# Patient Record
Sex: Male | Born: 1940 | Race: White | Hispanic: No | Marital: Single | Smoking: Former smoker
Health system: Southern US, Community
[De-identification: ages and names within clinical notes are randomized; demographics above are authoritative.]

## PROBLEM LIST (undated history)

## (undated) DIAGNOSIS — R42 Dizziness and giddiness: Secondary | ICD-10-CM

## (undated) DIAGNOSIS — M1990 Unspecified osteoarthritis, unspecified site: Secondary | ICD-10-CM

## (undated) DIAGNOSIS — N529 Male erectile dysfunction, unspecified: Secondary | ICD-10-CM

## (undated) DIAGNOSIS — E119 Type 2 diabetes mellitus without complications: Secondary | ICD-10-CM

## (undated) DIAGNOSIS — N400 Enlarged prostate without lower urinary tract symptoms: Secondary | ICD-10-CM

## (undated) DIAGNOSIS — E785 Hyperlipidemia, unspecified: Secondary | ICD-10-CM

## (undated) DIAGNOSIS — T7840XA Allergy, unspecified, initial encounter: Secondary | ICD-10-CM

## (undated) DIAGNOSIS — M549 Dorsalgia, unspecified: Secondary | ICD-10-CM

## (undated) HISTORY — DX: Dizziness and giddiness: R42

## (undated) HISTORY — DX: Benign prostatic hyperplasia without lower urinary tract symptoms: N40.0

## (undated) HISTORY — DX: Unspecified osteoarthritis, unspecified site: M19.90

## (undated) HISTORY — DX: Hyperlipidemia, unspecified: E78.5

## (undated) HISTORY — DX: Allergy, unspecified, initial encounter: T78.40XA

## (undated) HISTORY — DX: Male erectile dysfunction, unspecified: N52.9

## (undated) HISTORY — DX: Dorsalgia, unspecified: M54.9

## (undated) HISTORY — DX: Type 2 diabetes mellitus without complications: E11.9

---

## 1966-03-05 HISTORY — PX: TONSILLECTOMY: SUR1361

## 1997-08-06 ENCOUNTER — Encounter: Payer: Self-pay | Admitting: Family Medicine

## 1998-08-11 ENCOUNTER — Encounter: Payer: Self-pay | Admitting: Family Medicine

## 1998-08-11 LAB — CONVERTED CEMR LAB: PSA: 1.1 ng/mL

## 1999-07-27 ENCOUNTER — Encounter: Payer: Self-pay | Admitting: Family Medicine

## 2000-08-12 ENCOUNTER — Encounter: Payer: Self-pay | Admitting: Family Medicine

## 2001-11-06 ENCOUNTER — Encounter: Payer: Self-pay | Admitting: Family Medicine

## 2001-11-06 LAB — CONVERTED CEMR LAB
PSA: 0.5 ng/mL
TSH: 1.65 microintl units/mL

## 2002-12-30 ENCOUNTER — Encounter: Payer: Self-pay | Admitting: Family Medicine

## 2002-12-30 LAB — CONVERTED CEMR LAB
PSA: 0.6 ng/mL
TSH: 2.39 microintl units/mL

## 2004-02-01 ENCOUNTER — Ambulatory Visit: Payer: Self-pay | Admitting: Family Medicine

## 2004-02-25 ENCOUNTER — Ambulatory Visit: Payer: Self-pay | Admitting: Family Medicine

## 2004-07-26 ENCOUNTER — Ambulatory Visit: Payer: Self-pay | Admitting: Family Medicine

## 2004-07-26 LAB — CONVERTED CEMR LAB: Blood Glucose, Fasting: 106 mg/dL

## 2004-08-30 ENCOUNTER — Ambulatory Visit: Payer: Self-pay | Admitting: Family Medicine

## 2005-03-20 ENCOUNTER — Ambulatory Visit: Payer: Self-pay | Admitting: Family Medicine

## 2005-09-12 ENCOUNTER — Ambulatory Visit: Payer: Self-pay | Admitting: Family Medicine

## 2005-10-19 ENCOUNTER — Ambulatory Visit: Payer: Self-pay | Admitting: Family Medicine

## 2005-10-19 LAB — CONVERTED CEMR LAB: TSH: 1.78 microintl units/mL

## 2005-10-23 ENCOUNTER — Ambulatory Visit: Payer: Self-pay | Admitting: Family Medicine

## 2005-11-08 ENCOUNTER — Ambulatory Visit: Payer: Self-pay | Admitting: Family Medicine

## 2006-05-22 ENCOUNTER — Ambulatory Visit: Payer: Self-pay | Admitting: Family Medicine

## 2006-05-22 LAB — CONVERTED CEMR LAB: Creatinine, Ser: 1.3 mg/dL (ref 0.4–1.5)

## 2006-05-23 LAB — FECAL OCCULT BLOOD, GUAIAC: Fecal Occult Blood: NEGATIVE

## 2006-05-28 ENCOUNTER — Ambulatory Visit: Payer: Self-pay | Admitting: Cardiology

## 2006-05-29 ENCOUNTER — Ambulatory Visit: Payer: Self-pay | Admitting: Family Medicine

## 2007-08-05 ENCOUNTER — Encounter: Payer: Self-pay | Admitting: Family Medicine

## 2007-08-05 ENCOUNTER — Ambulatory Visit: Payer: Self-pay | Admitting: Family Medicine

## 2007-08-05 DIAGNOSIS — M129 Arthropathy, unspecified: Secondary | ICD-10-CM | POA: Insufficient documentation

## 2007-08-05 DIAGNOSIS — N401 Enlarged prostate with lower urinary tract symptoms: Secondary | ICD-10-CM | POA: Insufficient documentation

## 2007-08-05 DIAGNOSIS — T7840XA Allergy, unspecified, initial encounter: Secondary | ICD-10-CM | POA: Insufficient documentation

## 2007-08-05 DIAGNOSIS — R7309 Other abnormal glucose: Secondary | ICD-10-CM

## 2007-08-05 DIAGNOSIS — E785 Hyperlipidemia, unspecified: Secondary | ICD-10-CM | POA: Insufficient documentation

## 2007-09-08 ENCOUNTER — Ambulatory Visit: Payer: Self-pay | Admitting: Family Medicine

## 2007-09-08 LAB — CONVERTED CEMR LAB
ALT: 16 units/L (ref 0–53)
Alkaline Phosphatase: 60 units/L (ref 39–117)
Bilirubin, Direct: 0.1 mg/dL (ref 0.0–0.3)
CO2: 29 meq/L (ref 19–32)
Glucose, Bld: 96 mg/dL (ref 70–99)
HDL: 42.8 mg/dL (ref >39.0)
Hemoglobin: 16.8 g/dL (ref 13.0–17.0)
LDL Cholesterol: 107 mg/dL — ABNORMAL HIGH (ref 0–99)
Lymphocytes Relative: 32 % (ref 12.0–46.0)
Monocytes Relative: 11.2 % (ref 3.0–12.0)
Platelets: 156 10*3/uL (ref 150–400)
Potassium: 4.5 meq/L (ref 3.5–5.1)
RDW: 12.8 % (ref 11.5–14.6)
Sodium: 141 meq/L (ref 135–145)
Total CHOL/HDL Ratio: 4.2
Total Protein: 6.5 g/dL (ref 6.0–8.3)
Triglycerides: 147 mg/dL (ref 0–149)

## 2007-09-12 ENCOUNTER — Ambulatory Visit: Payer: Self-pay | Admitting: Family Medicine

## 2008-03-15 ENCOUNTER — Ambulatory Visit: Payer: Self-pay | Admitting: Family Medicine

## 2008-08-18 ENCOUNTER — Ambulatory Visit: Payer: Self-pay | Admitting: Family Medicine

## 2008-08-18 DIAGNOSIS — H8110 Benign paroxysmal vertigo, unspecified ear: Secondary | ICD-10-CM | POA: Insufficient documentation

## 2009-08-22 ENCOUNTER — Ambulatory Visit: Payer: Self-pay | Admitting: Family Medicine

## 2009-10-11 ENCOUNTER — Encounter (INDEPENDENT_AMBULATORY_CARE_PROVIDER_SITE_OTHER): Payer: Self-pay | Admitting: *Deleted

## 2010-03-08 ENCOUNTER — Encounter: Payer: Self-pay | Admitting: Family Medicine

## 2010-03-15 ENCOUNTER — Ambulatory Visit
Admission: RE | Admit: 2010-03-15 | Discharge: 2010-03-15 | Payer: Self-pay | Source: Home / Self Care | Attending: Family Medicine | Admitting: Family Medicine

## 2010-03-15 DIAGNOSIS — R109 Unspecified abdominal pain: Secondary | ICD-10-CM | POA: Insufficient documentation

## 2010-03-15 DIAGNOSIS — R1030 Lower abdominal pain, unspecified: Secondary | ICD-10-CM | POA: Insufficient documentation

## 2010-03-28 ENCOUNTER — Encounter: Payer: Self-pay | Admitting: Family Medicine

## 2010-04-05 NOTE — Assessment & Plan Note (Signed)
Summary: REFILL MEDICATIIONS/CLE   Vital Signs:  Patient profile:   70 year old male Height:      66 inches Weight:      164.75 pounds BMI:     26.69 Temp:     98.3 degrees F oral Pulse rate:   60 / minute Pulse rhythm:   regular BP sitting:   122 / 78  (left arm) Cuff size:   regular  Vitals Entered By: Sydell Axon LPN (August 22, 2009 8:58 AM) CC: Needs refills on medications   History of Present Illness: Pt here for medication refill. He is doing well and has no complaints. He still has very rare dizziness but otherwise is pretty much without any compaint. He tolerates his medications well without any problems. He takes his Mobic with food. 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 He enjoys his lifestyle.  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 1 Tablet At Bedtime 2)  Flomax 0.4 Mg  Cp24 (Tamsulosin Hcl) .Marland Kitchen.. 1 Tablet Daily By Mouth 3)  Proscar 5 Mg  Tabs (Finasteride) .Marland Kitchen.. 1 Tablet Daily By Mouth 4)  Viagra 50 Mg  Tabs (Sildenafil Citrate) .Marland Kitchen.. 1 Hour Pior To Relations 5)  Mobic 15 Mg  Tabs (Meloxicam) .... One Tab By Mouth After Supper. 6)  Antivert 25 Mg Tabs (Meclizine Hcl) .... One Tab By Mouth Every 6 Hrs As Needed.  Allergies: 1)  ! * Penicillin  Physical Exam  General:  Well-developed,well-nourished,in no acute distress; alert,appropriate and cooperative throughout examination. Head:  Normocephalic and atraumatic without obvious abnormalities. No apparent  alopecia or balding. Sinuses nontender. Eyes:  Conjunctiva clear bilaterally.  Ears:  External ear exam shows no significant lesions or deformities.  Otoscopic examination reveals clear canals, tympanic membranes are intact bilaterally without bulging, retraction, inflammation or discharge. Hearing is grossly normal bilaterally. Nose:  External nasal examination shows no deformity or inflammation. Nasal mucosa are pink and moist without lesions or exudates. Mouth:  Oral mucosa and oropharynx without lesions or exudates.  Teeth in good repair. Neck:  No deformities, masses, or tenderness noted. Chest Wall:  No deformities, masses, tenderness or gynecomastia noted. Lungs:  Normal respiratory effort, chest expands symmetrically. Lungs are clear to auscultation, no crackles or wheezes. Heart:  Normal rate and regular rhythm. S1 and S2 normal without gallop, murmur, click, rub or other extra sounds.   Impression & Recommendations:  Problem # 1:  BENIGN POSITIONAL VERTIGO (ICD-386.11) Assessment Improved Now with rare sxs. Tolerates reasonably well. His updated medication list for this problem includes:    Antivert 25 Mg Tabs (Meclizine hcl) ..... One tab by mouth every 6 hrs as needed.  Problem # 2:  UNSPECIFIED ARTHROPATHY MULTIPLE SITES (ICD-716.99) Assessment: Unchanged Is fairly active doing car work, in positions that challenge his skeleten! Cont careful use of Mobic. Always take with food.  Problem # 3:  HYPERLIPIDEMIA (ICD-272.4) Assessment: Unchanged Cont Lipitor. Recheck later this year. His updated medication list for this problem includes:  Lipitor 10 Mg Tabs (Atorvastatin calcium) .Marland Kitchen... 1 tablet at bedtime  Problem # 4:  HYPERGLYCEMIA (ICD-790.29) Assessment: Unchanged Discussed avoiding sweets and carbs. Recheck fasting later this year.  Problem # 5:  HYPERTROPHY PROSTATE W/UR OBST & OTH LUTS (ICD-600.01) Assessment: Unchanged Stable sxs on Flomax and Proscar. Cont. Will  check PSA later this year.  Complete Medication List: 1)  Lipitor 10 Mg Tabs (Atorvastatin calcium) .Marland Kitchen.. 1 tablet at bedtime 2)  Flomax 0.4 Mg Cp24 (Tamsulosin hcl) .Marland Kitchen.. 1 tablet daily by mouth 3)  Proscar 5 Mg Tabs (Finasteride) .Marland Kitchen.. 1 tablet daily by mouth 4)  Viagra 50 Mg Tabs (Sildenafil citrate) .Marland Kitchen.. 1 hour pior to relations 5)  Mobic 15 Mg Tabs (Meloxicam) .... One tab by mouth after supper. 6)  Antivert 25 Mg Tabs (Meclizine hcl) .... One tab by mouth every 6 hrs as needed.  Patient Instructions: 1)  RTC later this year for Comp Exam, labs prior. Prescriptions: MOBIC 15 MG  TABS (MELOXICAM) one tab by mouth after supper.  #90 x 3   Entered by:   Sydell Axon LPN   Authorized by:   Shaune Leeks MD   Signed by:   Sydell Axon LPN on 76/19/5093   Method used:   Print then Give to Patient   RxID:   [id number] PROSCAR 5 MG  TABS (FINASTERIDE) 1 tablet daily by mouth  #90 x 3   Entered by:   Sydell Axon LPN   Authorized by:   Shaune Leeks MD   Signed by:   Sydell Axon LPN on 50/53/9767   Method used:   Print then Give to Patient   RxID:   [id number] FLOMAX 0.4 MG  CP24 (TAMSULOSIN HCL) 1 tablet daily by mouth  #90 x 3   Entered by:   Sydell Axon LPN   Authorized by:   Shaune Leeks MD   Signed by:   Sydell Axon LPN on 29/92/4268   Method used:   Print then Give to Patient   RxID:   [id number] LIPITOR 10 MG  TABS (ATORVASTATIN CALCIUM) 1 tablet at bedtime  #90 x 3   Entered by:   Sydell Axon LPN   Authorized by:   Shaune Leeks MD   Signed by:   Sydell Axon LPN on 94/17/4081   Method used:   Print then Give to Patient   RxID:   [id number]   Current Allergies (reviewed today): ! * PENICILLIN

## 2010-04-05 NOTE — Letter (Signed)
Summary: Nadara Eaton letter  [facility]  [address] [place], [place] [address]   Phone: [phone]  Fax: [phone]       10/11/2009 MRN: [id number]  [facility] [address] [place], [place]  [address]  Dear Mr. [facility] [place], and [place] announce the retirement of Arta Silence, M.D., from full-time practice at the [facility] office effective September 01, 2009 and his plans of returning part-time.  It is important to Dr. Hetty Ely and to our practice that you understand that [facility] has seven physicians in our office for your health care needs.  We will continue to offer the same exceptional care that you have today.    Dr. Hetty Ely has spoken to many of you about his plans for retirement and returning part-time in the fall.   We will continue to work with you through the transition to schedule appointments for you in the office and meet the high standards that [place] is committed to.   Again, it is with great pleasure that we share the news that Dr. Hetty Ely will return to [facility] at [facility] in October of 2011 with a reduced schedule.    If you have any questions, or would like to request an appointment with one of our physicians, please call us at [phone] and press the option for Scheduling an appointment.  We take pleasure in providing you with excellent patient care and look forward to seeing you at your next office visit.  Our [facility] Physicians are:  Tillman Abide, M.D. Laurita Quint, M.D. Roxy Manns, M.D. Kerby Nora, M.D. Hannah Beat, M.D. Ruthe Mannan, M.D. We proudly welcomed Raechel Ache, M.D. and Eustaquio Boyden, M.D. to the practice in July/August 2011.  Sincerely,  [facility]

## 2010-04-06 NOTE — Assessment & Plan Note (Signed)
Summary: pain in lower groin area   Vital Signs:  Patient profile:   70 year old male Weight:      169 pounds Temp:     97.4 degrees F oral Pulse rate:   60 / minute Pulse rhythm:   regular BP sitting:   134 / 80  (left arm) Cuff size:   regular  Vitals Entered By: Sydell Axon LPN (March 15, 2010 8:43 AM) CC: Pain in lower groin area   History of Present Illness: Pt here for right sided lower inguinal pain. It does not appear to be assoc to staining...not assoc with straining, worse in the AM than in the evening. This has been going on for quite a while, had been 1-2/10 now typically 3-4 but increases to 6-8 at times. The dull lower pain is there all the time. He has pain with sitting and flexing the right leg. Standing is not as bad. He complained of this problem in 2008 and had CT abd and pelvis done then which were essentially unrevealing. He occas gets a shooting pain along the inguinal ligament distribution which is fleeting for seconds and resolves, again not particularly related to activity or position. Pushing does not recreate. He feels no bulge with pain. He took Celbrex in 2008 for joint discomfort and seemed to have no impact on this.  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 1 Tablet At Bedtime 2)  Flomax 0.4 Mg  Cp24 (Tamsulosin Hcl) .Marland Kitchen.. 1 Tablet Daily By Mouth 3)  Proscar 5 Mg  Tabs (Finasteride) .Marland Kitchen.. 1 Tablet Daily By Mouth 4)  Viagra 50 Mg  Tabs (Sildenafil Citrate) .Marland Kitchen.. 1 Hour Pior To Relations 5)  Mobic 15 Mg  Tabs (Meloxicam) .... One Tab By Mouth After Supper. 6)   Antivert 25 Mg Tabs (Meclizine Hcl) .... One Tab By Mouth Every 6 Hrs As Needed.  Allergies: 1)  ! * Penicillin  Past History:  Family History: Last updated: 08/05/2007 Father: dec 62  MI MULTIPLE  Mother: A  31  DM  HTN BROTHER  A 61  CAD BROTHER A 56   CV: + FATHER/ + BROTHER HBP: + MOTHER DM: +MOTHER PROSTATE CANCER: NEGATIVE GOUT/ARTHRITIS: + SELF BREAST/OVARIAN/UTERINE CANCER: NEGATIVE COLON CANCER: NEGATIVE DEPRESSION: ETOH/DRUG ABUSE: OTHER NEGATIVE STROKES  Social History: Last updated: 08/05/2007 Occupation:[employer] Married lives w/ wife  Risk Factors: Alcohol Use: 0 (08/05/2007) Caffeine Use: 1 (08/05/2007) Exercise: yes (08/05/2007)  Risk Factors: Smoking Status: quit (08/05/2007) Packs/Day: 1993--20PYH (08/05/2007) Passive Smoke Exposure: no (08/05/2007)  Past Surgical History: TONSILLECTOMY 1968 CT ABD Mild GB Thickening HH   CT PELVIS Mild Disc Bulge L5/S1   05/28/2006  Physical Exam  General:  Well-developed,well-nourished,in no acute distress; alert,appropriate and cooperative throughout examination. Lungs:  Normal respiratory effort, chest expands symmetrically. Lungs are clear to auscultation, no crackles or wheezes. Heart:  Normal rate and regular rhythm. S1 and S2 normal without gallop, murmur, click, rub or other extra sounds. Abdomen:  Bowel sounds positive,abdomen soft and non-tender without masses, organomegaly or hernias noted. No pain to palpation along right inguinal ligament. No pain with flex/ext or resisted flex/ext.  Genitalia:  Testes bilaterally descended without nodularity, tenderness or masses. No scrotal masses or lesions. No penis lesions or urethral discharge.   Impression & Recommendations:  Problem # 1:  INGUINAL PAIN, RIGHT (ICD-789.09) Assessment Deteriorated  Slightly worse over the years. Will have pt see Dr Lemar Livings for assessment. Does not act like inguinal ligament strain but is the correct distribution. No  activity over the holidays has not improved. His updated medication list for this problem includes:    Mobic 15 Mg Tabs (Meloxicam) ..... One tab by mouth after supper.  Orders: Surgical Referral (Surgery)  Complete Medication List: 1)  Lipitor 10 Mg Tabs (Atorvastatin calcium) .Marland Kitchen.. 1 tablet at bedtime 2)  Flomax 0.4 Mg Cp24 (Tamsulosin hcl) .Marland Kitchen.. 1 tablet daily by mouth 3)  Proscar 5 Mg Tabs (Finasteride) .Marland Kitchen.. 1 tablet daily by mouth 4)  Viagra 50 Mg Tabs (Sildenafil citrate) .Marland Kitchen.. 1 hour pior to relations 5)  Mobic 15 Mg Tabs (Meloxicam) .... One tab by mouth after supper. 6)  Antivert 25 Mg Tabs (Meclizine hcl) .... One tab by mouth every 6 hrs as needed.  Patient Instructions: 1)  RTC for Comp Exam, labs prior.  2)  Refer to Dr Lemar Livings.   Orders Added: 1)  Surgical Referral [Surgery] 2)  Est. Patient Level III [[address]]    Current Allergies (reviewed today): ! * PENICILLIN

## 2010-04-06 NOTE — Medication Information (Signed)
Summary: [employer] Drug Safety Consideration Form  [employer] Drug Safety Consideration Form   Imported By: Beau Fanny 03/09/2010 13:57:08  _____________________________________________________________________  External Attachment:    Type:   Image     Comment:   External Document

## 2010-04-08 ENCOUNTER — Encounter: Payer: Self-pay | Admitting: Family Medicine

## 2010-04-12 NOTE — Letter (Signed)
Summary: [facility]  [facility]   Imported By: Kassie Mends 04/07/2010 09:16:34  _____________________________________________________________________  External Attachment:    Type:   Image     Comment:   External Document

## 2010-05-22 ENCOUNTER — Other Ambulatory Visit (INDEPENDENT_AMBULATORY_CARE_PROVIDER_SITE_OTHER): Payer: Medicare Other

## 2010-05-22 ENCOUNTER — Other Ambulatory Visit: Payer: Self-pay | Admitting: Family Medicine

## 2010-05-22 ENCOUNTER — Encounter: Payer: Self-pay | Admitting: *Deleted

## 2010-05-22 DIAGNOSIS — H8110 Benign paroxysmal vertigo, unspecified ear: Secondary | ICD-10-CM

## 2010-05-22 DIAGNOSIS — E785 Hyperlipidemia, unspecified: Secondary | ICD-10-CM

## 2010-05-22 DIAGNOSIS — R7309 Other abnormal glucose: Secondary | ICD-10-CM

## 2010-05-22 DIAGNOSIS — N401 Enlarged prostate with lower urinary tract symptoms: Secondary | ICD-10-CM

## 2010-05-22 LAB — MICROALBUMIN / CREATININE URINE RATIO
Creatinine,U: 120.6 mg/dL
Microalb Creat Ratio: 0.4 mg/g (ref 0.0–30.0)
Microalb, Ur: 0.5 mg/dL (ref 0.0–1.9)

## 2010-05-22 LAB — HEPATIC FUNCTION PANEL
ALT: 18 U/L (ref 0–53)
AST: 22 U/L (ref 0–37)
Alkaline Phosphatase: 57 U/L (ref 39–117)
Bilirubin, Direct: 0.1 mg/dL (ref 0.0–0.3)
Total Bilirubin: 0.8 mg/dL (ref 0.3–1.2)
Total Protein: 6.6 g/dL (ref 6.0–8.3)

## 2010-05-22 LAB — BASIC METABOLIC PANEL
BUN: 20 mg/dL (ref 6–23)
Chloride: 104 mEq/L (ref 96–112)
Creatinine, Ser: 1.2 mg/dL (ref 0.4–1.5)
GFR: 67.03 mL/min (ref >60.00)
Potassium: 4.6 mEq/L (ref 3.5–5.1)

## 2010-05-22 LAB — CBC WITH DIFFERENTIAL/PLATELET
Basophils Relative: 0.7 % (ref 0.0–3.0)
Eosinophils Relative: 4.8 % (ref 0.0–5.0)
MCV: 92.4 fl (ref 78.0–100.0)
Monocytes Relative: 9.7 % (ref 3.0–12.0)
Neutrophils Relative %: 53.4 % (ref 43.0–77.0)
Platelets: 154 10*3/uL (ref 150.0–400.0)
RBC: 5.28 Mil/uL (ref 4.22–5.81)
WBC: 6.4 10*3/uL (ref 4.5–10.5)

## 2010-05-22 LAB — LIPID PANEL
LDL Cholesterol: 102 mg/dL — ABNORMAL HIGH (ref 0–99)
Total CHOL/HDL Ratio: 4
VLDL: 31.2 mg/dL (ref 0.0–40.0)

## 2010-05-25 ENCOUNTER — Encounter: Payer: Self-pay | Admitting: Family Medicine

## 2010-05-25 ENCOUNTER — Ambulatory Visit (INDEPENDENT_AMBULATORY_CARE_PROVIDER_SITE_OTHER): Payer: Medicare Other | Admitting: Family Medicine

## 2010-05-25 VITALS — BP 128/76 | HR 76 | Temp 98.1°F | Ht 66.0 in | Wt 168.5 lb

## 2010-05-25 DIAGNOSIS — Z0000 Encounter for general adult medical examination without abnormal findings: Secondary | ICD-10-CM | POA: Insufficient documentation

## 2010-05-25 DIAGNOSIS — R109 Unspecified abdominal pain: Secondary | ICD-10-CM

## 2010-05-25 DIAGNOSIS — M129 Arthropathy, unspecified: Secondary | ICD-10-CM

## 2010-05-25 DIAGNOSIS — H8110 Benign paroxysmal vertigo, unspecified ear: Secondary | ICD-10-CM

## 2010-05-25 DIAGNOSIS — R7309 Other abnormal glucose: Secondary | ICD-10-CM

## 2010-05-25 DIAGNOSIS — N401 Enlarged prostate with lower urinary tract symptoms: Secondary | ICD-10-CM

## 2010-05-25 DIAGNOSIS — E785 Hyperlipidemia, unspecified: Secondary | ICD-10-CM

## 2010-05-25 NOTE — Assessment & Plan Note (Signed)
Will refer for colonoscopy. He has seen Dr Lemar Livings and prefers to return there.

## 2010-05-25 NOTE — Patient Instructions (Addendum)
Refer to Dr Lemar Livings for Colonoscopy Watch diet and exercise as discussed. Lose weight. Return to clinic one year, sooner prn.

## 2010-05-25 NOTE — Assessment & Plan Note (Signed)
Not a problem recently. Pain resolved.

## 2010-05-25 NOTE — Assessment & Plan Note (Signed)
Sxs very infrequent and he knows how to minimize sxs.

## 2010-05-25 NOTE — Assessment & Plan Note (Signed)
Takes Meloxicam with food and is well controlled.

## 2010-05-25 NOTE — Assessment & Plan Note (Signed)
Chol adequate, can always be better. Discussed diet control.

## 2010-05-25 NOTE — Assessment & Plan Note (Signed)
Stable. Knows to avoid sweets and carbs as much as able.

## 2010-05-25 NOTE — Assessment & Plan Note (Signed)
Sxs well controlled on curr tx.

## 2010-05-25 NOTE — Progress Notes (Addendum)
  Subjective:    Patient ID: Steven Hall, male    DOB: 25-Jun-1940, 70 y.o.   MRN: [id number]  HPI Pt here for Comp Exam. He has elbow pain and has had injections in both and has had help in the remote past. He otherwise is feeling well with no complaint.   Review of Systems  Constitutional: Negative for fever, chills, activity change, appetite change, fatigue and unexpected weight change.  HENT: Positive for tinnitus. Negative for hearing loss, ear pain, congestion, sneezing, trouble swallowing, neck pain, neck stiffness, postnasal drip and ear discharge.   Eyes: Negative for pain, discharge and visual disturbance.  Respiratory: Negative for cough, shortness of breath and wheezing.   Cardiovascular: Negative for chest pain and palpitations.  Gastrointestinal: Negative for nausea, vomiting, abdominal pain, diarrhea, constipation, blood in stool, abdominal distention, anal bleeding and rectal pain.  Genitourinary: Negative for dysuria, frequency and difficulty urinating.  Musculoskeletal: Positive for back pain and arthralgias. Negative for myalgias.  Skin: Negative for color change and rash.  Neurological: Negative for tremors and numbness.  Hematological: Negative for adenopathy. Does not bruise/bleed easily.  Psychiatric/Behavioral: Negative for sleep disturbance, dysphoric mood and agitation.       Objective:   Physical Exam  Constitutional: He is oriented to person, place, and time. He appears well-developed and well-nourished. No distress.  HENT:  Head: Normocephalic and atraumatic.  Right Ear: External ear normal.  Left Ear: External ear normal.  Nose: Nose normal.  Mouth/Throat: Oropharynx is clear and moist.  Eyes: Conjunctivae and EOM are normal. Pupils are equal, round, and reactive to light. No scleral icterus.       Had eye exam 9/11.  Neck: Normal range of motion. Neck supple. No thyromegaly present.  Cardiovascular: Normal rate, regular rhythm, normal heart sounds  and intact distal pulses.   No murmur heard. Pulmonary/Chest: Effort normal and breath sounds normal. No respiratory distress. He has no wheezes. He exhibits no tenderness.  Abdominal: Soft. Bowel sounds are normal. He exhibits no mass. There is no tenderness.  Genitourinary: Rectum normal, prostate normal and penis normal. Guaiac negative stool.  Musculoskeletal: Normal range of motion. He exhibits no tenderness.  Lymphadenopathy:    He has no cervical adenopathy.  Neurological: He is alert and oriented to person, place, and time. He has normal reflexes. He exhibits normal muscle tone. Coordination normal.  Skin: Skin is dry. No rash noted. He is not diaphoretic.  Psychiatric: He has a normal mood and affect. His behavior is normal. Judgment and thought content normal.          Assessment & Plan:   Medicare PE   I have personally reviewed the Medicare Annual Wellness questionnaire and have noted 1. The patient's medical and social history 2. Their use of alcohol, tobacco or illicit drugs 3. Their current medications and supplements 4. The patient's functional ability including ADL's, fall risks, home safety risks and hearing or visual             impairment. 5. Diet and physical activities 6. Evidence for depression or mood disorders   I find no cognitive impairment. He has nml affect, cognition, memory and alertness.

## 2010-06-05 NOTE — Progress Notes (Signed)
Encounter made in error. 

## 2010-06-05 NOTE — Progress Notes (Deleted)
  Subjective:    Patient ID: Steven Hall, male    DOB: 1940/08/03, 70 y.o.   MRN: [id number]  HPI    Review of Systems     Objective:   Physical Exam        Assessment & Plan:

## 2010-06-06 ENCOUNTER — Other Ambulatory Visit: Payer: Self-pay

## 2010-07-04 ENCOUNTER — Ambulatory Visit: Payer: Self-pay | Admitting: General Surgery

## 2010-07-17 ENCOUNTER — Encounter: Payer: Self-pay | Admitting: Family Medicine

## 2010-08-17 ENCOUNTER — Other Ambulatory Visit: Payer: Self-pay | Admitting: *Deleted

## 2010-08-17 MED ORDER — ATORVASTATIN CALCIUM 10 MG PO TABS: 10.0000 mg | ORAL_TABLET | Freq: Every day | ORAL | Status: DC

## 2010-08-17 MED ORDER — MELOXICAM 15 MG PO TABS: 15.0000 mg | ORAL_TABLET | ORAL | Status: DC

## 2010-08-17 MED ORDER — FINASTERIDE 5 MG PO TABS: 5.0000 mg | ORAL_TABLET | Freq: Every day | ORAL | Status: DC

## 2010-08-17 MED ORDER — TAMSULOSIN HCL 0.4 MG PO CAPS: 0.4000 mg | ORAL_CAPSULE | Freq: Every day | ORAL | Status: DC

## 2011-05-22 ENCOUNTER — Other Ambulatory Visit: Payer: Self-pay | Admitting: Family Medicine

## 2011-05-22 DIAGNOSIS — E7800 Pure hypercholesterolemia, unspecified: Secondary | ICD-10-CM

## 2011-05-22 DIAGNOSIS — N400 Enlarged prostate without lower urinary tract symptoms: Secondary | ICD-10-CM

## 2011-05-28 ENCOUNTER — Other Ambulatory Visit (INDEPENDENT_AMBULATORY_CARE_PROVIDER_SITE_OTHER): Payer: BC Managed Care – PPO

## 2011-05-28 DIAGNOSIS — N138 Other obstructive and reflux uropathy: Secondary | ICD-10-CM | POA: Diagnosis not present

## 2011-05-28 DIAGNOSIS — N400 Enlarged prostate without lower urinary tract symptoms: Secondary | ICD-10-CM

## 2011-05-28 DIAGNOSIS — N401 Enlarged prostate with lower urinary tract symptoms: Secondary | ICD-10-CM | POA: Diagnosis not present

## 2011-05-28 DIAGNOSIS — E7800 Pure hypercholesterolemia, unspecified: Secondary | ICD-10-CM

## 2011-05-28 LAB — PSA, MEDICARE: PSA: 0.29 ng/mL (ref <=4.00)

## 2011-05-28 LAB — COMPREHENSIVE METABOLIC PANEL
Alkaline Phosphatase: 55 U/L (ref 39–117)
BUN: 25 mg/dL — ABNORMAL HIGH (ref 6–23)
CO2: 27 mEq/L (ref 19–32)
GFR: 64.24 mL/min (ref >60.00)
Glucose, Bld: 106 mg/dL — ABNORMAL HIGH (ref 70–99)
Total Bilirubin: 0.7 mg/dL (ref 0.3–1.2)

## 2011-05-28 LAB — LIPID PANEL
Cholesterol: 168 mg/dL (ref 0–200)
HDL: 45.8 mg/dL (ref >39.00)
Triglycerides: 200 mg/dL — ABNORMAL HIGH (ref 0.0–149.0)
VLDL: 40 mg/dL (ref 0.0–40.0)

## 2011-06-06 ENCOUNTER — Encounter: Payer: Medicare Other | Admitting: Family Medicine

## 2011-06-07 ENCOUNTER — Encounter: Payer: Self-pay | Admitting: Family Medicine

## 2011-06-07 ENCOUNTER — Ambulatory Visit (INDEPENDENT_AMBULATORY_CARE_PROVIDER_SITE_OTHER): Payer: Medicare Other | Admitting: Family Medicine

## 2011-06-07 VITALS — BP 116/70 | HR 69 | Temp 98.3°F | Ht 66.5 in | Wt 170.0 lb

## 2011-06-07 DIAGNOSIS — Z0000 Encounter for general adult medical examination without abnormal findings: Secondary | ICD-10-CM

## 2011-06-07 DIAGNOSIS — B353 Tinea pedis: Secondary | ICD-10-CM

## 2011-06-07 DIAGNOSIS — R7309 Other abnormal glucose: Secondary | ICD-10-CM | POA: Diagnosis not present

## 2011-06-07 DIAGNOSIS — E785 Hyperlipidemia, unspecified: Secondary | ICD-10-CM | POA: Diagnosis not present

## 2011-06-07 DIAGNOSIS — Z23 Encounter for immunization: Secondary | ICD-10-CM

## 2011-06-07 DIAGNOSIS — N401 Enlarged prostate with lower urinary tract symptoms: Secondary | ICD-10-CM

## 2011-06-07 DIAGNOSIS — M129 Arthropathy, unspecified: Secondary | ICD-10-CM | POA: Diagnosis not present

## 2011-06-07 DIAGNOSIS — N138 Other obstructive and reflux uropathy: Secondary | ICD-10-CM | POA: Diagnosis not present

## 2011-06-07 MED ORDER — ATORVASTATIN CALCIUM 10 MG PO TABS: 10.0000 mg | ORAL_TABLET | Freq: Every day | ORAL | Status: DC

## 2011-06-07 MED ORDER — KETOCONAZOLE 2 % EX CREA: TOPICAL_CREAM | Freq: Every day | CUTANEOUS | Status: AC

## 2011-06-07 MED ORDER — MELOXICAM 15 MG PO TABS: 15.0000 mg | ORAL_TABLET | ORAL | Status: DC

## 2011-06-07 MED ORDER — TAMSULOSIN HCL 0.4 MG PO CAPS: 0.4000 mg | ORAL_CAPSULE | Freq: Every day | ORAL | Status: DC

## 2011-06-07 MED ORDER — FINASTERIDE 5 MG PO TABS: 5.0000 mg | ORAL_TABLET | Freq: Every day | ORAL | Status: DC

## 2011-06-07 NOTE — Progress Notes (Signed)
Addended by: Annamarie Major on: 06/07/2011 09:54 AM   Modules accepted: Orders

## 2011-06-07 NOTE — Assessment & Plan Note (Signed)
Needs to exercise to work on weight and sugar. Recheck yearly.

## 2011-06-07 NOTE — Assessment & Plan Note (Signed)
Cont lipitor, exercise to work on weight and TG.

## 2011-06-07 NOTE — Assessment & Plan Note (Signed)
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  No ADE.

## 2011-06-07 NOTE — Assessment & Plan Note (Signed)
Stable DRE, minimal sx.  Continue meds. PSA wnl.

## 2011-06-07 NOTE — Patient Instructions (Addendum)
I would get a flu shot each fall.   Check with your insurance to see if they will cover the shingles shot. Use the 2% ketoconazole and talc your feet daily.  Change socks twice a day if feet are wet.  Get back to exercising and recheck labs in 1 year.   Glad to see you.

## 2011-06-07 NOTE — Progress Notes (Signed)
I have personally reviewed the Medicare Annual Wellness questionnaire and have noted 1. The patient's medical and social history 2. Their use of alcohol, tobacco or illicit drugs 3. Their current medications and supplements 4. The patient's functional ability including ADL's, fall risks, home safety risks and hearing or visual             impairment. 5. Diet and physical activities 6. Evidence for depression or mood disorders  The patients weight, height, BMI have been recorded in the chart, and visual acuity is per eye clinic.  I have made referrals, counseling and provided education to the patient based review of the above and I have provided the pt with a written personalized care plan for preventive services.  Td and PNA shot due  Flu encouraged for fall.   Shingles shot encouraged.   PSA wnl Colonoscopy done 2012 AAA screening.  Not needed due to CT done prev.    Elevated Cholesterol: Using medications without problems: yes Muscle aches: none known Diet compliance: yes Exercise: some- walking for exercise.    BPH.  Compliant with meds.  PSA wnl.  Stream is variable.  Occ nocturia, not every night.  No burning, unless he stops the meds.   Recently with trouble with athletes foot, not improved with OTC meds.  Changing socks daily.  Feet sweat.    Hand OA, controlled with meds.  No ADE.  Tolerated well.  Taking it with food.  Good effect.   PMH and SH reviewed  ROS: See HPI, otherwise noncontributory.  Meds, vitals, and allergies reviewed.   GEN: nad, alert and oriented HEENT: mucous membranes moist NECK: supple w/o LA CV: rrr.  no murmur PULM: ctab, no inc wob ABD: soft, +bs EXT: no edema SKIN: no acute rash except for typical appearing tinea between 3rd and 4th toes B Prostate gland firm and smooth, no enlargement, nodularity, tenderness, mass, asymmetry or induration.

## 2011-06-07 NOTE — Assessment & Plan Note (Signed)
Bilateral, need to talc and change socks frequently.  Use topical ketoconazole.  F/u prn.

## 2012-05-14 DIAGNOSIS — H2510 Age-related nuclear cataract, unspecified eye: Secondary | ICD-10-CM | POA: Diagnosis not present

## 2012-06-17 ENCOUNTER — Other Ambulatory Visit: Payer: Self-pay | Admitting: Family Medicine

## 2012-06-17 NOTE — Telephone Encounter (Signed)
Sent, please schedule a CPE.  Thanks.   

## 2012-06-17 NOTE — Telephone Encounter (Signed)
Electronic refill requests.  Patient has not been seen in > 1 year with no upcoming appts scheduled.  Please advise.

## 2012-11-30 ENCOUNTER — Other Ambulatory Visit: Payer: Self-pay | Admitting: Family Medicine

## 2012-11-30 DIAGNOSIS — E785 Hyperlipidemia, unspecified: Secondary | ICD-10-CM

## 2012-11-30 DIAGNOSIS — N401 Enlarged prostate with lower urinary tract symptoms: Secondary | ICD-10-CM

## 2012-11-30 DIAGNOSIS — Z125 Encounter for screening for malignant neoplasm of prostate: Secondary | ICD-10-CM

## 2012-12-08 ENCOUNTER — Other Ambulatory Visit (INDEPENDENT_AMBULATORY_CARE_PROVIDER_SITE_OTHER): Payer: Medicare Other

## 2012-12-08 DIAGNOSIS — E785 Hyperlipidemia, unspecified: Secondary | ICD-10-CM | POA: Diagnosis not present

## 2012-12-08 DIAGNOSIS — Z125 Encounter for screening for malignant neoplasm of prostate: Secondary | ICD-10-CM | POA: Diagnosis not present

## 2012-12-08 LAB — COMPREHENSIVE METABOLIC PANEL
ALT: 16 U/L (ref 0–53)
AST: 21 U/L (ref 0–37)
Alkaline Phosphatase: 49 U/L (ref 39–117)
CO2: 26 mEq/L (ref 19–32)
Calcium: 9.3 mg/dL (ref 8.4–10.5)
Chloride: 104 mEq/L (ref 96–112)
Creatinine, Ser: 1.1 mg/dL (ref 0.4–1.5)
GFR: 67.9 mL/min (ref >60.00)
Glucose, Bld: 101 mg/dL — ABNORMAL HIGH (ref 70–99)
Potassium: 4.6 mEq/L (ref 3.5–5.1)
Total Bilirubin: 0.7 mg/dL (ref 0.3–1.2)

## 2012-12-08 LAB — LIPID PANEL
HDL: 51.9 mg/dL (ref >39.00)
LDL Cholesterol: 107 mg/dL — ABNORMAL HIGH (ref 0–99)
Total CHOL/HDL Ratio: 4
Triglycerides: 165 mg/dL — ABNORMAL HIGH (ref 0.0–149.0)
VLDL: 33 mg/dL (ref 0.0–40.0)

## 2012-12-08 LAB — PSA, MEDICARE: PSA: 0.26 ng/ml (ref 0.10–4.00)

## 2012-12-15 ENCOUNTER — Ambulatory Visit (INDEPENDENT_AMBULATORY_CARE_PROVIDER_SITE_OTHER): Payer: Medicare Other | Admitting: Family Medicine

## 2012-12-15 ENCOUNTER — Encounter: Payer: Self-pay | Admitting: Family Medicine

## 2012-12-15 VITALS — BP 130/80 | HR 62 | Temp 97.5°F | Ht 66.0 in | Wt 169.0 lb

## 2012-12-15 DIAGNOSIS — N401 Enlarged prostate with lower urinary tract symptoms: Secondary | ICD-10-CM | POA: Diagnosis not present

## 2012-12-15 DIAGNOSIS — K644 Residual hemorrhoidal skin tags: Secondary | ICD-10-CM | POA: Diagnosis not present

## 2012-12-15 DIAGNOSIS — E785 Hyperlipidemia, unspecified: Secondary | ICD-10-CM | POA: Diagnosis not present

## 2012-12-15 DIAGNOSIS — N138 Other obstructive and reflux uropathy: Secondary | ICD-10-CM

## 2012-12-15 DIAGNOSIS — R109 Unspecified abdominal pain: Secondary | ICD-10-CM

## 2012-12-15 DIAGNOSIS — Z0000 Encounter for general adult medical examination without abnormal findings: Secondary | ICD-10-CM

## 2012-12-15 MED ORDER — FINASTERIDE 5 MG PO TABS: ORAL_TABLET | ORAL | Status: DC

## 2012-12-15 MED ORDER — MELOXICAM 15 MG PO TABS: ORAL_TABLET | ORAL | Status: DC

## 2012-12-15 MED ORDER — ATORVASTATIN CALCIUM 10 MG PO TABS: ORAL_TABLET | ORAL | Status: DC

## 2012-12-15 MED ORDER — TAMSULOSIN HCL 0.4 MG PO CAPS: ORAL_CAPSULE | ORAL | Status: DC

## 2012-12-15 MED ORDER — TRAMADOL HCL 50 MG PO TABS: 50.0000 mg | ORAL_TABLET | Freq: Three times a day (TID) | ORAL | Status: DC | PRN

## 2012-12-15 NOTE — Assessment & Plan Note (Addendum)
See scanned forms.  Routine anticipatory guidance given to patient.  See health maintenance. Flu encouraged.  Shingles encouraged.  PNA 2013 Tetanus 2013 Colon 2012 Prostate cancer screening PSA wnl.  Advance directive encouraged- would have wife designated if incapacitated.   Cognitive function addressed- see scanned forms- and if abnormal then additional documentation follows.  AAA screening not needed due to CT done prev.

## 2012-12-15 NOTE — Assessment & Plan Note (Signed)
Doing well, continue current meds.

## 2012-12-15 NOTE — Assessment & Plan Note (Signed)
With [facility] noted.  Would continue to observe for now, given patient pref.  This is likely the source of the lower abd pain. If worsening, refer to surgery for eval. He agrees. I don't suspect other colonic/intraabdominal issue.

## 2012-12-15 NOTE — Assessment & Plan Note (Signed)
Not acute, likely source of BRBPR.  If worsening, refer to surgery for eval. He agrees. I don't suspect other colonic/intraabdominal issue.

## 2012-12-15 NOTE — Patient Instructions (Addendum)
Check with your insurance to see if they will cover the shingles shot. Use the tramadol for pain if needed.  If the pain or bleeding increases, then we can help you get set up with the surgery clinic.  Take care.  I would get a flu shot each fall.

## 2012-12-15 NOTE — Assessment & Plan Note (Signed)
Reasonable control, he'll continue meds and work on diet/weight. Sugar also discussed. He agrees.

## 2012-12-15 NOTE — Progress Notes (Signed)
I have personally reviewed the Medicare Annual Wellness questionnaire and have noted 1. The patient's medical and social history 2. Their use of alcohol, tobacco or illicit drugs 3. Their current medications and supplements 4. The patient's functional ability including ADL's, fall risks, home safety risks and hearing or visual             impairment. 5. Diet and physical activities 6. Evidence for depression or mood disorders  The patients weight, height, BMI have been recorded in the chart and visual acuity is per eye clinic.  I have made referrals, counseling and provided education to the patient based review of the above and I have provided the pt with a written personalized care plan for preventive services.  See scanned forms.  Routine anticipatory guidance given to patient.  See health maintenance. Flu encouraged.  Shingles encouraged.  PNA 2013 Tetanus 2013 Colon 2012 Prostate cancer screening PSA wnl.  Advance directive encouraged- would have wife designated if incapacitated.   Cognitive function addressed- see scanned forms- and if abnormal then additional documentation follows.   Back pain. R lower side.  Intermittent.  mobic helps some.  Known DDD. occ radiation into the RLQ and R anterior thigh over the last few months. No trauma. No L sided sx.  No rash.    Blood in stool.  Noted x2, over the last ~ month.  Noted with prev episode of frequent BMs.  Prev colonoscopy done 2012 w/o sig findings.  Elevated Cholesterol: Using medications without problems:yes Muscle aches: no Diet compliance:yes Exercise:yes  BPH.  Controlled with meds.  No LUTS with meds.  Doing well.    PMH and SH reviewed  Meds, vitals, and allergies reviewed.   ROS: See HPI.  Otherwise negative.    GEN: nad, alert and oriented HEENT: mucous membranes moist NECK: supple w/o LA CV: rrr. PULM: ctab, no inc wob ABD: soft, +bs, not ttp x4 EXT: no edema SKIN: no acute rash External hemorrhoid  noted RIH noted, small, soft. No midline back pain.

## 2013-09-01 DIAGNOSIS — H2510 Age-related nuclear cataract, unspecified eye: Secondary | ICD-10-CM | POA: Diagnosis not present

## 2013-09-01 DIAGNOSIS — H5210 Myopia, unspecified eye: Secondary | ICD-10-CM | POA: Diagnosis not present

## 2014-01-10 ENCOUNTER — Other Ambulatory Visit: Payer: Self-pay | Admitting: Family Medicine

## 2014-01-11 NOTE — Telephone Encounter (Signed)
Patient not seen in our office in > 1 year.  No upcoming appt scheduled. Please advise.

## 2014-01-12 NOTE — Telephone Encounter (Signed)
Sent, schedule CPE/AMW, thanks.

## 2014-01-12 NOTE — Telephone Encounter (Signed)
Patient notified by telephone that he needs to schedule appointment per Dr. Damita Dunnings. Patient transferred to scheduler.

## 2014-03-09 ENCOUNTER — Encounter: Payer: Self-pay | Admitting: Family Medicine

## 2014-03-09 ENCOUNTER — Ambulatory Visit (INDEPENDENT_AMBULATORY_CARE_PROVIDER_SITE_OTHER): Payer: Medicare Other | Admitting: Family Medicine

## 2014-03-09 VITALS — BP 136/90 | HR 91 | Temp 98.0°F | Ht 66.5 in | Wt 173.5 lb

## 2014-03-09 DIAGNOSIS — Z7189 Other specified counseling: Secondary | ICD-10-CM

## 2014-03-09 DIAGNOSIS — E785 Hyperlipidemia, unspecified: Secondary | ICD-10-CM | POA: Diagnosis not present

## 2014-03-09 DIAGNOSIS — Z125 Encounter for screening for malignant neoplasm of prostate: Secondary | ICD-10-CM

## 2014-03-09 DIAGNOSIS — N401 Enlarged prostate with lower urinary tract symptoms: Secondary | ICD-10-CM

## 2014-03-09 DIAGNOSIS — Z0000 Encounter for general adult medical examination without abnormal findings: Secondary | ICD-10-CM

## 2014-03-09 DIAGNOSIS — M129 Arthropathy, unspecified: Secondary | ICD-10-CM

## 2014-03-09 MED ORDER — TAMSULOSIN HCL 0.4 MG PO CAPS: 0.4000 mg | ORAL_CAPSULE | Freq: Every day | ORAL | Status: DC

## 2014-03-09 MED ORDER — MELOXICAM 15 MG PO TABS: ORAL_TABLET | ORAL | Status: DC

## 2014-03-09 MED ORDER — ATORVASTATIN CALCIUM 10 MG PO TABS: ORAL_TABLET | ORAL | Status: DC

## 2014-03-09 MED ORDER — FINASTERIDE 5 MG PO TABS: 5.0000 mg | ORAL_TABLET | Freq: Every day | ORAL | Status: DC

## 2014-03-09 NOTE — Patient Instructions (Addendum)
Check with your insurance to see if they will cover the shingles shot. Please think about getting a flu shot.  Schedule a fasting lab visit on the way out.  Take care.  Glad to see you.

## 2014-03-09 NOTE — Progress Notes (Signed)
Pre visit review using our clinic review tool, if applicable. No additional management support is needed unless otherwise documented below in the visit note.  I have personally reviewed the Medicare Annual Wellness questionnaire and have noted 1. The patient's medical and social history 2. Their use of alcohol, tobacco or illicit drugs 3. Their current medications and supplements 4. The patient's functional ability including ADL's, fall risks, home safety risks and hearing or visual             impairment. 5. Diet and physical activities 6. Evidence for depression or mood disorders  The patients weight, height, BMI have been recorded in the chart and visual acuity is per eye clinic.  I have made referrals, counseling and provided education to the patient based review of the above and I have provided the pt with a written personalized care plan for preventive services.  Provider list updated- see scanned forms.  Routine anticipatory guidance given to patient.  See health maintenance.  Flu dw pt.  Shingles dw pt.  PNA 2013 Tetanus 2013 Colonoscopy 2012 PSA pending.  He'll return for labs.  Advance directive- wife designated if patient were incapacitated.  Cognitive function addressed- see scanned forms- and if abnormal then additional documentation follows.  AAA screening not needed due to CT done prev.   LUTS controlled with meds.  Stream is good.  Nocturia 0-1 per night.  No ADE on meds.  Compliant with meds.   Elevated Cholesterol: Using medications without problems:yes Muscle aches: no Diet compliance:yes Exercise:yes, splitting wood recently Labs pending, will return when fasting.   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   Worse with weather changes.  No ADE on meds.  No new sx.    PMH and SH reviewed  Meds, vitals, and allergies reviewed.   ROS: See HPI.  Otherwise negative.    GEN: nad, alert and oriented HEENT: mucous membranes moist NECK: supple w/o LA CV:  rrr. PULM: ctab, no inc wob ABD: soft, +bs EXT: no edema SKIN: no acute rash

## 2014-03-10 DIAGNOSIS — Z7189 Other specified counseling: Secondary | ICD-10-CM | POA: Insufficient documentation

## 2014-03-10 NOTE — Assessment & Plan Note (Signed)
Continue current med.  Sx controlled.  Return for labs.

## 2014-03-10 NOTE — Assessment & Plan Note (Signed)
Continue current med.  D/w pt about diet and exercise.  Return for labs.

## 2014-03-10 NOTE — Assessment & Plan Note (Signed)
Flu dw pt.  Shingles dw pt.  PNA 2013 Tetanus 2013 Colonoscopy 2012 PSA pending.  He'll return for labs.  Advance directive- wife designated if patient were incapacitated.  Cognitive function addressed- see scanned forms- and if abnormal then additional documentation follows.  AAA screening not needed due to CT done prev.

## 2014-03-10 NOTE — Assessment & Plan Note (Signed)
Continue current med.  No ADE on meds.  D/w pt.

## 2014-03-11 ENCOUNTER — Other Ambulatory Visit (INDEPENDENT_AMBULATORY_CARE_PROVIDER_SITE_OTHER): Payer: Medicare Other

## 2014-03-11 DIAGNOSIS — Z125 Encounter for screening for malignant neoplasm of prostate: Secondary | ICD-10-CM | POA: Diagnosis not present

## 2014-03-11 DIAGNOSIS — E785 Hyperlipidemia, unspecified: Secondary | ICD-10-CM

## 2014-03-11 LAB — COMPREHENSIVE METABOLIC PANEL
ALBUMIN: 4.1 g/dL (ref 3.5–5.2)
ALT: 20 U/L (ref 0–53)
AST: 22 U/L (ref 0–37)
Alkaline Phosphatase: 73 U/L (ref 39–117)
BUN: 18 mg/dL (ref 6–23)
CALCIUM: 9.2 mg/dL (ref 8.4–10.5)
CHLORIDE: 105 meq/L (ref 96–112)
CO2: 27 mEq/L (ref 19–32)
Creatinine, Ser: 1.2 mg/dL (ref 0.4–1.5)
GFR: 63.12 mL/min (ref >60.00)
Glucose, Bld: 123 mg/dL — ABNORMAL HIGH (ref 70–99)
POTASSIUM: 4.6 meq/L (ref 3.5–5.1)
Sodium: 139 mEq/L (ref 135–145)
Total Bilirubin: 0.8 mg/dL (ref 0.2–1.2)
Total Protein: 6.7 g/dL (ref 6.0–8.3)

## 2014-03-11 LAB — LIPID PANEL
CHOLESTEROL: 222 mg/dL — AB (ref 0–200)
HDL: 45.2 mg/dL (ref >39.00)
NonHDL: 176.8
Total CHOL/HDL Ratio: 5
Triglycerides: 201 mg/dL — ABNORMAL HIGH (ref 0.0–149.0)
VLDL: 40.2 mg/dL — AB (ref 0.0–40.0)

## 2014-03-11 LAB — LDL CHOLESTEROL, DIRECT: Direct LDL: 136.8 mg/dL

## 2014-03-11 LAB — PSA, MEDICARE: PSA: 0.29 ng/mL (ref 0.10–4.00)

## 2014-03-12 ENCOUNTER — Other Ambulatory Visit: Payer: Self-pay | Admitting: Family Medicine

## 2014-03-12 DIAGNOSIS — T50905A Adverse effect of unspecified drugs, medicaments and biological substances, initial encounter: Secondary | ICD-10-CM | POA: Insufficient documentation

## 2014-03-12 DIAGNOSIS — R739 Hyperglycemia, unspecified: Secondary | ICD-10-CM | POA: Insufficient documentation

## 2014-03-12 DIAGNOSIS — E785 Hyperlipidemia, unspecified: Secondary | ICD-10-CM

## 2014-04-28 DIAGNOSIS — B351 Tinea unguium: Secondary | ICD-10-CM | POA: Diagnosis not present

## 2014-04-28 DIAGNOSIS — D229 Melanocytic nevi, unspecified: Secondary | ICD-10-CM | POA: Diagnosis not present

## 2014-04-28 DIAGNOSIS — L821 Other seborrheic keratosis: Secondary | ICD-10-CM | POA: Diagnosis not present

## 2014-04-28 DIAGNOSIS — L820 Inflamed seborrheic keratosis: Secondary | ICD-10-CM | POA: Diagnosis not present

## 2014-04-28 DIAGNOSIS — L578 Other skin changes due to chronic exposure to nonionizing radiation: Secondary | ICD-10-CM | POA: Diagnosis not present

## 2014-04-28 DIAGNOSIS — D692 Other nonthrombocytopenic purpura: Secondary | ICD-10-CM | POA: Diagnosis not present

## 2014-04-28 DIAGNOSIS — L814 Other melanin hyperpigmentation: Secondary | ICD-10-CM | POA: Diagnosis not present

## 2014-04-28 DIAGNOSIS — Z1283 Encounter for screening for malignant neoplasm of skin: Secondary | ICD-10-CM | POA: Diagnosis not present

## 2014-04-28 DIAGNOSIS — L570 Actinic keratosis: Secondary | ICD-10-CM | POA: Diagnosis not present

## 2014-09-01 DIAGNOSIS — L570 Actinic keratosis: Secondary | ICD-10-CM | POA: Diagnosis not present

## 2014-09-01 DIAGNOSIS — L578 Other skin changes due to chronic exposure to nonionizing radiation: Secondary | ICD-10-CM | POA: Diagnosis not present

## 2014-09-01 DIAGNOSIS — L821 Other seborrheic keratosis: Secondary | ICD-10-CM | POA: Diagnosis not present

## 2014-09-08 ENCOUNTER — Other Ambulatory Visit (INDEPENDENT_AMBULATORY_CARE_PROVIDER_SITE_OTHER): Payer: Medicare Other

## 2014-09-08 DIAGNOSIS — E785 Hyperlipidemia, unspecified: Secondary | ICD-10-CM | POA: Diagnosis not present

## 2014-09-08 DIAGNOSIS — R739 Hyperglycemia, unspecified: Secondary | ICD-10-CM

## 2014-09-08 LAB — HEMOGLOBIN A1C: Hgb A1c MFr Bld: 6.2 % (ref 4.6–6.5)

## 2014-09-08 LAB — LIPID PANEL
CHOL/HDL RATIO: 4
Cholesterol: 161 mg/dL (ref 0–200)
HDL: 45.4 mg/dL (ref >39.00)
LDL Cholesterol: 92 mg/dL (ref 0–99)
NONHDL: 115.6
TRIGLYCERIDES: 116 mg/dL (ref 0.0–149.0)
VLDL: 23.2 mg/dL (ref 0.0–40.0)

## 2014-09-08 LAB — GLUCOSE, RANDOM: GLUCOSE: 118 mg/dL — AB (ref 70–99)

## 2014-09-14 ENCOUNTER — Ambulatory Visit (INDEPENDENT_AMBULATORY_CARE_PROVIDER_SITE_OTHER): Payer: Medicare Other | Admitting: Family Medicine

## 2014-09-14 ENCOUNTER — Encounter: Payer: Self-pay | Admitting: Family Medicine

## 2014-09-14 VITALS — BP 130/70 | HR 78 | Temp 98.4°F | Wt 171.0 lb

## 2014-09-14 DIAGNOSIS — E785 Hyperlipidemia, unspecified: Secondary | ICD-10-CM | POA: Diagnosis not present

## 2014-09-14 DIAGNOSIS — R739 Hyperglycemia, unspecified: Secondary | ICD-10-CM | POA: Diagnosis not present

## 2014-09-14 NOTE — Assessment & Plan Note (Signed)
Needs to cut out sweets, continue exercise, recheck labs in about 6 months before a physical.

## 2014-09-14 NOTE — Assessment & Plan Note (Signed)
Improved, continue statin, see above re: diet and exercise. Labs d/w pt.

## 2014-09-14 NOTE — Patient Instructions (Addendum)
Recheck in about 6 months (labs before a physical). Take care.  Glad to see you.  Goal weight loss- about 1 pound a month.  I would cut back on the sweets.

## 2014-09-14 NOTE — Progress Notes (Signed)
Pre visit review using our clinic review tool, if applicable. No additional management support is needed unless otherwise documented below in the visit note.  Elevated Cholesterol: Using medications without problems: yes Muscle aches: no Diet compliance: yes Exercise: some, at home.    Hyperglycemia.  Didn't cut out sweets.  Labs d/w pt.  A1c 6.2.  D/w pt about diet and exercise.    Meds, vitals, and allergies reviewed.   ROS: See HPI.  Otherwise, noncontributory.  GEN: nad, alert and oriented HEENT: mucous membranes moist NECK: supple w/o LA CV: rrr PULM: ctab, no inc wob ABD: soft, +bs EXT: no edema

## 2014-11-11 DIAGNOSIS — H43393 Other vitreous opacities, bilateral: Secondary | ICD-10-CM | POA: Diagnosis not present

## 2014-11-11 DIAGNOSIS — H5203 Hypermetropia, bilateral: Secondary | ICD-10-CM | POA: Diagnosis not present

## 2014-11-11 DIAGNOSIS — H43313 Vitreous membranes and strands, bilateral: Secondary | ICD-10-CM | POA: Diagnosis not present

## 2015-02-07 ENCOUNTER — Other Ambulatory Visit: Payer: Self-pay | Admitting: Family Medicine

## 2015-03-13 ENCOUNTER — Other Ambulatory Visit: Payer: Self-pay | Admitting: Family Medicine

## 2015-03-14 NOTE — Telephone Encounter (Signed)
Electronic refill request. Last Filled:    90 tablet 3 03/09/2014  Patient is due for CPE.  Please advise.

## 2015-03-15 NOTE — Telephone Encounter (Signed)
Schedule CPE.  Sent.  Thanks.  

## 2015-03-15 NOTE — Telephone Encounter (Signed)
Left detailed message on voicemail.  

## 2015-05-07 ENCOUNTER — Other Ambulatory Visit: Payer: Self-pay | Admitting: Family Medicine

## 2015-05-09 NOTE — Telephone Encounter (Signed)
Message left on patient's VM to schedule CPE prior to further refills.

## 2015-06-11 ENCOUNTER — Telehealth: Payer: Self-pay | Admitting: Family Medicine

## 2015-06-13 NOTE — Telephone Encounter (Signed)
Electronic refill request.  Last Filled:   03/14/15 with notation to schedule CPE prior to further refills.  No future appts scheduled.  Last office visit:   09/14/14   Please advise.

## 2015-06-14 ENCOUNTER — Encounter: Payer: Self-pay | Admitting: Family Medicine

## 2015-06-14 NOTE — Telephone Encounter (Signed)
Please schedule appointment as instructed. 

## 2015-06-14 NOTE — Telephone Encounter (Signed)
Sent.  Needs CPE scheduled.  Thanks.  

## 2015-06-14 NOTE — Telephone Encounter (Signed)
Left message asking pt to call office  °

## 2015-06-16 NOTE — Telephone Encounter (Signed)
4/20 pt aware Pt only wanted to make med refill appointment

## 2015-06-23 ENCOUNTER — Encounter: Payer: Self-pay | Admitting: Family Medicine

## 2015-06-23 ENCOUNTER — Ambulatory Visit (INDEPENDENT_AMBULATORY_CARE_PROVIDER_SITE_OTHER): Payer: Medicare Other | Admitting: Family Medicine

## 2015-06-23 VITALS — BP 122/70 | HR 75 | Temp 98.5°F | Wt 169.8 lb

## 2015-06-23 DIAGNOSIS — E785 Hyperlipidemia, unspecified: Secondary | ICD-10-CM | POA: Diagnosis not present

## 2015-06-23 DIAGNOSIS — Z125 Encounter for screening for malignant neoplasm of prostate: Secondary | ICD-10-CM

## 2015-06-23 DIAGNOSIS — R739 Hyperglycemia, unspecified: Secondary | ICD-10-CM | POA: Diagnosis not present

## 2015-06-23 DIAGNOSIS — Z23 Encounter for immunization: Secondary | ICD-10-CM

## 2015-06-23 DIAGNOSIS — N401 Enlarged prostate with lower urinary tract symptoms: Secondary | ICD-10-CM

## 2015-06-23 DIAGNOSIS — M129 Arthropathy, unspecified: Secondary | ICD-10-CM

## 2015-06-23 LAB — LIPID PANEL
CHOLESTEROL: 176 mg/dL (ref 0–200)
HDL: 45.5 mg/dL (ref >39.00)
NonHDL: 130.92
Total CHOL/HDL Ratio: 4
Triglycerides: 231 mg/dL — ABNORMAL HIGH (ref 0.0–149.0)
VLDL: 46.2 mg/dL — AB (ref 0.0–40.0)

## 2015-06-23 LAB — COMPREHENSIVE METABOLIC PANEL
ALBUMIN: 4.2 g/dL (ref 3.5–5.2)
ALT: 11 U/L (ref 0–53)
AST: 17 U/L (ref 0–37)
Alkaline Phosphatase: 68 U/L (ref 39–117)
BUN: 20 mg/dL (ref 6–23)
CALCIUM: 9.5 mg/dL (ref 8.4–10.5)
CHLORIDE: 107 meq/L (ref 96–112)
CO2: 27 meq/L (ref 19–32)
CREATININE: 1.17 mg/dL (ref 0.40–1.50)
GFR: 64.77 mL/min (ref >60.00)
Glucose, Bld: 120 mg/dL — ABNORMAL HIGH (ref 70–99)
POTASSIUM: 4.4 meq/L (ref 3.5–5.1)
Sodium: 139 mEq/L (ref 135–145)
Total Bilirubin: 0.5 mg/dL (ref 0.2–1.2)
Total Protein: 6.6 g/dL (ref 6.0–8.3)

## 2015-06-23 LAB — HEMOGLOBIN A1C: Hgb A1c MFr Bld: 6.2 % (ref 4.6–6.5)

## 2015-06-23 LAB — LDL CHOLESTEROL, DIRECT: Direct LDL: 100 mg/dL

## 2015-06-23 LAB — PSA, MEDICARE: PSA: 0.5 ng/ml (ref 0.10–4.00)

## 2015-06-23 MED ORDER — MELOXICAM 15 MG PO TABS: ORAL_TABLET | ORAL | Status: DC

## 2015-06-23 MED ORDER — TRAMADOL HCL 50 MG PO TABS: 50.0000 mg | ORAL_TABLET | Freq: Three times a day (TID) | ORAL | Status: DC | PRN

## 2015-06-23 MED ORDER — ATORVASTATIN CALCIUM 10 MG PO TABS: ORAL_TABLET | ORAL | Status: DC

## 2015-06-23 MED ORDER — FINASTERIDE 5 MG PO TABS: ORAL_TABLET | ORAL | Status: DC

## 2015-06-23 MED ORDER — TAMSULOSIN HCL 0.4 MG PO CAPS: ORAL_CAPSULE | ORAL | Status: DC

## 2015-06-23 NOTE — Patient Instructions (Addendum)
Check with your insurance to see if they will cover the shingles shot. Go to the lab on the way out.  We'll contact you with your lab report. Take care.  Glad to see you.  Recheck next year, sooner if needed.

## 2015-06-23 NOTE — Progress Notes (Signed)
Pre visit review using our clinic review tool, if applicable. No additional management support is needed unless otherwise documented below in the visit note.  BPH.  Compliant with meds.  No LUTS.  Doing well.  Not lightheaded.  He is clearly improved on meds.  No frequency.  Psa pending.   Elevated Cholesterol: Using medications without problems:yes Muscle aches: no Diet compliance:yes Exercise:yes Due for labs.  Pending.    OA.  Rare use of tramadol.  Taking meloxicam w/o ADE. B hand OA.  No new sx.  H/o mild hyperglycemia.  Due for f/u A1c.  D/w pt.   Advance directive- wife designated if patient were incapacitated.   Due for PNA vaccine, d/w pt.  He agrees.   Shingles shot d/w pt.     PMH and SH reviewed  ROS: See HPI, otherwise noncontributory.  Meds, vitals, and allergies reviewed.   GEN: nad, alert and oriented HEENT: mucous membranes moist NECK: supple w/o LA CV: rrr.  no murmur PULM: ctab, no inc wob ABD: soft, +bs EXT: no edema SKIN: no acute rash DRE deferred.

## 2015-06-23 NOTE — Assessment & Plan Note (Signed)
Continue statin.  No ADE.  Labs pending.

## 2015-06-23 NOTE — Assessment & Plan Note (Signed)
Compliant with meds.  No LUTS.  Doing well.  Not lightheaded.  He is clearly improved on meds.  No frequency.  Psa pending.

## 2015-06-23 NOTE — Assessment & Plan Note (Signed)
H/o dx, recheck A1c.  D/w pt.  He agrees.

## 2015-06-23 NOTE — Assessment & Plan Note (Signed)
Continue meloxicam with prn tramadol use.  Recheck Cr today.  No ADE on med.

## 2015-06-27 ENCOUNTER — Encounter: Payer: Self-pay | Admitting: *Deleted

## 2015-09-01 DIAGNOSIS — L981 Factitial dermatitis: Secondary | ICD-10-CM | POA: Diagnosis not present

## 2015-09-01 DIAGNOSIS — L578 Other skin changes due to chronic exposure to nonionizing radiation: Secondary | ICD-10-CM | POA: Diagnosis not present

## 2015-09-01 DIAGNOSIS — Z1283 Encounter for screening for malignant neoplasm of skin: Secondary | ICD-10-CM | POA: Diagnosis not present

## 2015-09-01 DIAGNOSIS — L821 Other seborrheic keratosis: Secondary | ICD-10-CM | POA: Diagnosis not present

## 2015-09-01 DIAGNOSIS — L812 Freckles: Secondary | ICD-10-CM | POA: Diagnosis not present

## 2015-09-01 DIAGNOSIS — L570 Actinic keratosis: Secondary | ICD-10-CM | POA: Diagnosis not present

## 2015-09-01 DIAGNOSIS — D229 Melanocytic nevi, unspecified: Secondary | ICD-10-CM | POA: Diagnosis not present

## 2015-09-01 DIAGNOSIS — D1800 Hemangioma unspecified site: Secondary | ICD-10-CM | POA: Diagnosis not present

## 2015-12-12 DIAGNOSIS — D485 Neoplasm of uncertain behavior of skin: Secondary | ICD-10-CM | POA: Diagnosis not present

## 2015-12-12 DIAGNOSIS — L820 Inflamed seborrheic keratosis: Secondary | ICD-10-CM | POA: Diagnosis not present

## 2015-12-12 DIAGNOSIS — D692 Other nonthrombocytopenic purpura: Secondary | ICD-10-CM | POA: Diagnosis not present

## 2015-12-12 DIAGNOSIS — L578 Other skin changes due to chronic exposure to nonionizing radiation: Secondary | ICD-10-CM | POA: Diagnosis not present

## 2015-12-12 DIAGNOSIS — D0461 Carcinoma in situ of skin of right upper limb, including shoulder: Secondary | ICD-10-CM | POA: Diagnosis not present

## 2015-12-12 DIAGNOSIS — L812 Freckles: Secondary | ICD-10-CM | POA: Diagnosis not present

## 2015-12-12 DIAGNOSIS — L821 Other seborrheic keratosis: Secondary | ICD-10-CM | POA: Diagnosis not present

## 2015-12-12 DIAGNOSIS — L570 Actinic keratosis: Secondary | ICD-10-CM | POA: Diagnosis not present

## 2016-07-09 ENCOUNTER — Other Ambulatory Visit: Payer: Self-pay | Admitting: Family Medicine

## 2016-08-14 ENCOUNTER — Other Ambulatory Visit: Payer: Self-pay | Admitting: Family Medicine

## 2016-08-14 NOTE — Telephone Encounter (Signed)
Electronic refill request. Last office visit:   06/23/2015 Last Filled:   Meloxicam  90 tablet 3 06/23/2015  Last Filled:   Tamsulosin   90 capsule 3 06/23/2015  Last Filled:   Finasteride 90 tablet 3 06/23/2015  Please advise.   No upcoming appts scheduled.

## 2016-08-16 NOTE — Telephone Encounter (Signed)
Sent.  Needs yearly physical/labs.  Thanks.

## 2016-08-16 NOTE — Telephone Encounter (Signed)
Left detailed message on voicemail.  

## 2016-10-26 ENCOUNTER — Emergency Department: Payer: Medicare Other

## 2016-10-26 ENCOUNTER — Emergency Department
Admission: EM | Admit: 2016-10-26 | Discharge: 2016-10-26 | Disposition: A | Discharge status: Home / Self Care | Payer: Medicare Other | Attending: Emergency Medicine | Admitting: Emergency Medicine

## 2016-10-26 ENCOUNTER — Encounter: Payer: Self-pay | Admitting: Emergency Medicine

## 2016-10-26 DIAGNOSIS — Y999 Unspecified external cause status: Secondary | ICD-10-CM | POA: Insufficient documentation

## 2016-10-26 DIAGNOSIS — Y929 Unspecified place or not applicable: Secondary | ICD-10-CM | POA: Insufficient documentation

## 2016-10-26 DIAGNOSIS — Y939 Activity, unspecified: Secondary | ICD-10-CM | POA: Insufficient documentation

## 2016-10-26 DIAGNOSIS — S8262XA Displaced fracture of lateral malleolus of left fibula, initial encounter for closed fracture: Secondary | ICD-10-CM | POA: Insufficient documentation

## 2016-10-26 DIAGNOSIS — W010XXA Fall on same level from slipping, tripping and stumbling without subsequent striking against object, initial encounter: Secondary | ICD-10-CM | POA: Insufficient documentation

## 2016-10-26 DIAGNOSIS — Z87891 Personal history of nicotine dependence: Secondary | ICD-10-CM | POA: Diagnosis not present

## 2016-10-26 DIAGNOSIS — S99912A Unspecified injury of left ankle, initial encounter: Secondary | ICD-10-CM | POA: Diagnosis present

## 2016-10-26 DIAGNOSIS — Z79899 Other long term (current) drug therapy: Secondary | ICD-10-CM | POA: Diagnosis not present

## 2016-10-26 DIAGNOSIS — S82892A Other fracture of left lower leg, initial encounter for closed fracture: Secondary | ICD-10-CM

## 2016-10-26 MED ORDER — IBUPROFEN 400 MG PO TABS
400.0000 mg | ORAL_TABLET | Freq: Once | ORAL | Status: DC
Start: 2016-10-26 — End: 2016-10-26

## 2016-10-26 NOTE — ED Provider Notes (Signed)
[facility] Emergency Department Provider Note   ____________________________________________   First MD Initiated Contact with Patient 10/26/16 1453     (approximate)  I have reviewed the triage vital signs and the nursing notes.   HISTORY  Chief Complaint Ankle Injury    HPI Steven Hall is a 76 y.o. male patient complaining of left lateral ankle pain and affect secondary to a slip and fall.Patient state he felt a "pop" with immediate pain and edema to the lateral aspect of the left ankle. Patient rates pain as a 7/10. Patient described a pain as "achy". Ice pack was applied in triage.   Past Medical History:  Diagnosis Date  . Allergy   . Arthritis    B hands  . Back pain    lumbar disc disease  . BPH (benign prostatic hyperplasia)   . ED (erectile dysfunction)   . Hyperlipidemia   . Inguinal hernia   . Vertigo     Patient Active Problem List   Diagnosis Date Noted  . Hyperglycemia 03/12/2014  . Advance care planning 03/10/2014  . External hemorrhoid 12/15/2012  . Medicare annual wellness visit, subsequent 05/25/2010  . INGUINAL PAIN, RIGHT 03/15/2010  . BENIGN POSITIONAL VERTIGO 08/18/2008  . HLD (hyperlipidemia) 08/05/2007  . Enlarged prostate with lower urinary tract symptoms (LUTS) 08/05/2007  . 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 ALLERGY, ENVIRONMENTAL 08/05/2007    Past Surgical History:  Procedure Laterality Date  . TONSILLECTOMY  1968    Prior to Admission medications   Medication Sig Start Date End Date Taking? Authorizing Provider  atorvastatin (LIPITOR) 10 MG tablet TAKE 1 TABLET AT BEDTIME 07/09/16   Tonia Ghent, MD  finasteride (PROSCAR) 5 MG tablet TAKE 1 TABLET DAILY 08/15/16   Tonia Ghent, MD  loratadine (CLARITIN) 10 MG tablet Take 10 mg by mouth as needed.      [provider]  meloxicam (MOBIC) 15 MG tablet TAKE 1 TABLET DAILY AFTER SUPPER 08/15/16   Tonia Ghent, MD  tamsulosin  (FLOMAX) 0.4 MG CAPS capsule TAKE 1 CAPSULE DAILY 08/15/16   Tonia Ghent, MD  traMADol (ULTRAM) 50 MG tablet Take 1 tablet (50 mg total) by mouth every 8 (eight) hours as needed. 06/23/15   Tonia Ghent, MD    Allergies Penicillins  Family History  Problem Relation Age of Onset  . Coronary artery disease Brother 17  . Diabetes Mother   . Hypertension Mother   . Heart disease Father   . Colon cancer Neg Hx   . Prostate cancer Neg Hx     Social History Social History  Substance Use Topics  . Smoking status: Former Research scientist (life sciences)  . Smokeless tobacco: Former Systems developer    Quit date: 03/05/2001  . Alcohol use Yes     Comment: occ    Review of Systems  Constitutional: No fever/chills Eyes: No visual changes. ENT: No sore throat. Cardiovascular: Denies chest pain. Respiratory: Denies shortness of breath. Gastrointestinal: No abdominal pain.  No nausea, no vomiting.  No diarrhea.  No constipation. Genitourinary: Negative for dysuria. Musculoskeletal: Negative for back pain. Skin: Negative for rash. Neurological: Negative for headaches, focal weakness or numbness. Endocrine:Hyperlipidemia Allergic/Immunilogical: Penicillin  ____________________________________________   PHYSICAL EXAM:  VITAL SIGNS: ED Triage Vitals  Enc Vitals Group     BP 10/26/16 1427 (!) 143/91     Pulse Rate 10/26/16 1427 90     Resp 10/26/16 1427 16     Temp 10/26/16 1427  98.1 F (36.7 C)     Temp Source 10/26/16 1427 Oral     SpO2 10/26/16 1427 95 %     Weight 10/26/16 1427 165 lb (74.8 kg)     Height 10/26/16 1427 5\' 7"  (1.702 m)     Head Circumference --      Peak Flow --      Pain Score 10/26/16 1426 7     Pain Loc --      Pain Edu? --      Excl. in [place]? --     Constitutional: Alert and oriented. Well appearing and in no acute distress. Cardiovascular: Normal rate, regular rhythm. Grossly normal heart sounds.  Good peripheral circulation. Respiratory: Normal respiratory effort.  No  retractions. Lungs CTAB. Musculoskeletal: Obvious deformity to the left lateral ankle. Moderate edema and guarding with palpation of the lateral malleolus.  Neurologic:  Normal speech and language. No gross focal neurologic deficits are appreciated. No gait instability. Skin:  Skin is warm, dry and intact. No rash noted. Psychiatric: Mood and affect are normal. Speech and behavior are normal.  ____________________________________________   LABS (all labs ordered are listed, but only abnormal results are displayed)  Labs Reviewed - No data to display ____________________________________________  EKG   ____________________________________________  RADIOLOGY  Dg Ankle Complete Left  Result Date: 10/26/2016 CLINICAL DATA:  Left ankle pain after fall today EXAM: LEFT ANKLE COMPLETE - 3+ VIEW COMPARISON:  None. FINDINGS: Diffuse left ankle soft tissue swelling, most prominent laterally. Oblique left distal fibula fracture extending to the level of the left ankle mortise, with minimal over riding and minimal 2 mm lateral displacement of the dominant distal fracture fragment. No additional fracture. No subluxation. Small plantar left calcaneal spur. No radiopaque foreign body. IMPRESSION: Minimally displaced distal left fibula fracture.  No subluxation. Electronically Signed   By: Ilona Sorrel M.D.   On: 10/26/2016 14:59   X-ray consistent with a distal fibula fracture of the left lower extremity. ___________________________________________   PROCEDURES  Procedure(s) performed: None  Procedures  Critical Care performed: No  ____________________________________________   INITIAL IMPRESSION / ASSESSMENT AND PLAN / ED COURSE  Pertinent labs & imaging results that were available during my care of the patient were reviewed by me and considered in my medical decision making (see chart for details).  Left distal fibular fracture. Discussed x-ray finding with patient. Patient placed in a  splint and given crutches for ambulation. Patient advised continue pain medication at home and to take ibuprofen 400 mg for swelling. Patient advised to follow orthopedics by calling for an appointment in 3 days.       ____________________________________________   FINAL CLINICAL IMPRESSION(S) / ED DIAGNOSES  Final diagnoses:  Closed fracture of left ankle, initial encounter      NEW MEDICATIONS STARTED DURING THIS VISIT:  New Prescriptions   No medications on file     Note:  This document was prepared using Dragon voice recognition software and may include unintentional dictation errors.    Sable Feil, PA-C 10/26/16 [place], [place], MD 10/28/16 2325

## 2016-10-26 NOTE — Discharge Instructions (Signed)
Wear splint and ambulate with crutches until evaluation by orthopedic doctor. Call Monday morning at 8:30 and tell them you are following up from the emergency room. Continue previous medications take ibuprofen as needed for swelling.

## 2016-11-12 ENCOUNTER — Other Ambulatory Visit: Payer: Self-pay | Admitting: Family Medicine

## 2016-11-13 NOTE — Telephone Encounter (Signed)
Electronic refill request.  Last office visit:   06/23/2015 Last Filled:   Finasteride 90 tablet 0 08/15/2016  Last Filled:   Tamsulosin 90 capsule 0 08/15/2016  Please advise.

## 2016-11-14 ENCOUNTER — Telehealth: Payer: Self-pay | Admitting: Family Medicine

## 2016-11-14 NOTE — Telephone Encounter (Signed)
Left pt message asking to call Ebony Hail back directly at [phone] to schedule AWV + labs with Katha Cabal and CPE with PCP.  *NOTE* Last AWV 03/09/14

## 2016-11-14 NOTE — Telephone Encounter (Signed)
Please schedule appointment per Dr. Damita Dunnings.

## 2016-11-14 NOTE — Telephone Encounter (Signed)
Due for f/u, needs AMW scheduled.

## 2016-11-14 NOTE — Telephone Encounter (Signed)
LVM for pt to call back and schedule AWV and CPE

## 2016-12-06 ENCOUNTER — Ambulatory Visit (INDEPENDENT_AMBULATORY_CARE_PROVIDER_SITE_OTHER): Payer: Medicare Other

## 2016-12-06 ENCOUNTER — Other Ambulatory Visit: Payer: Self-pay | Admitting: Family Medicine

## 2016-12-06 VITALS — BP 126/80 | HR 69 | Temp 97.6°F | Ht 67.0 in | Wt 168.0 lb

## 2016-12-06 DIAGNOSIS — Z125 Encounter for screening for malignant neoplasm of prostate: Secondary | ICD-10-CM | POA: Diagnosis not present

## 2016-12-06 DIAGNOSIS — R739 Hyperglycemia, unspecified: Secondary | ICD-10-CM

## 2016-12-06 DIAGNOSIS — E785 Hyperlipidemia, unspecified: Secondary | ICD-10-CM

## 2016-12-06 DIAGNOSIS — Z0000 Encounter for general adult medical examination without abnormal findings: Secondary | ICD-10-CM

## 2016-12-06 LAB — COMPREHENSIVE METABOLIC PANEL
ALBUMIN: 4.4 g/dL (ref 3.5–5.2)
ALK PHOS: 55 U/L (ref 39–117)
ALT: 13 U/L (ref 0–53)
AST: 14 U/L (ref 0–37)
BILIRUBIN TOTAL: 0.7 mg/dL (ref 0.2–1.2)
BUN: 22 mg/dL (ref 6–23)
CALCIUM: 10 mg/dL (ref 8.4–10.5)
CO2: 30 mEq/L (ref 19–32)
Chloride: 103 mEq/L (ref 96–112)
Creatinine, Ser: 1.32 mg/dL (ref 0.40–1.50)
GFR: 56.13 mL/min — AB (ref >60.00)
Glucose, Bld: 113 mg/dL — ABNORMAL HIGH (ref 70–99)
POTASSIUM: 5.4 meq/L — AB (ref 3.5–5.1)
Sodium: 140 mEq/L (ref 135–145)
TOTAL PROTEIN: 7.2 g/dL (ref 6.0–8.3)

## 2016-12-06 LAB — LIPID PANEL
CHOLESTEROL: 199 mg/dL (ref 0–200)
HDL: 55.9 mg/dL (ref >39.00)
NonHDL: 143.14
TRIGLYCERIDES: 231 mg/dL — AB (ref 0.0–149.0)
Total CHOL/HDL Ratio: 4
VLDL: 46.2 mg/dL — ABNORMAL HIGH (ref 0.0–40.0)

## 2016-12-06 LAB — HEMOGLOBIN A1C: Hgb A1c MFr Bld: 6.3 % (ref 4.6–6.5)

## 2016-12-06 LAB — PSA, MEDICARE: PSA: 0.3 ng/mL (ref 0.10–4.00)

## 2016-12-06 LAB — LDL CHOLESTEROL, DIRECT: Direct LDL: 115 mg/dL

## 2016-12-06 NOTE — Progress Notes (Signed)
Subjective:   Steven Hall is a 76 y.o. male who presents for Medicare Annual/Subsequent preventive examination.  Review of Systems:  N/A Cardiac Risk Factors include: advanced age (>31men, >21 women);male gender;dyslipidemia     Objective:    Vitals: BP 126/80 (BP Location: Right Arm, Patient Position: Sitting, Cuff Size: Normal)   Pulse 69   Temp 97.6 F (36.4 C) (Oral)   Ht 5\' 7"  (1.702 m) Comment: shoe, boot  Wt 168 lb (76.2 kg) Comment: shoe, boot  SpO2 96%   BMI 26.31 kg/m   Body mass index is 26.31 kg/m.  Tobacco History  Smoking Status  . Former Smoker  Smokeless Tobacco  . Former Systems developer  . Quit date: 03/05/2001     Counseling given: No   Past Medical History:  Diagnosis Date  . Allergy   . Arthritis    B hands  . Back pain    lumbar disc disease  . BPH (benign prostatic hyperplasia)   . ED (erectile dysfunction)   . Hyperlipidemia   . Inguinal hernia   . Vertigo    Past Surgical History:  Procedure Laterality Date  . TONSILLECTOMY  1968   Family History  Problem Relation Age of Onset  . Coronary artery disease Brother 20  . Diabetes Mother   . Hypertension Mother   . Heart disease Father   . Colon cancer Neg Hx   . Prostate cancer Neg Hx    History  Sexual Activity  . Sexual activity: Not on file    Outpatient Encounter Prescriptions as of 12/06/2016  Medication Sig  . atorvastatin (LIPITOR) 10 MG tablet TAKE 1 TABLET AT BEDTIME  . finasteride (PROSCAR) 5 MG tablet TAKE 1 TABLET DAILY  . loratadine (CLARITIN) 10 MG tablet Take 10 mg by mouth as needed.    . meloxicam (MOBIC) 15 MG tablet TAKE 1 TABLET DAILY AFTER SUPPER  . tamsulosin (FLOMAX) 0.4 MG CAPS capsule TAKE 1 CAPSULE DAILY  . traMADol (ULTRAM) 50 MG tablet Take 1 tablet (50 mg total) by mouth every 8 (eight) hours as needed.   No facility-administered encounter medications on file as of 12/06/2016.     Activities of Daily Living In your present state of health, do you  have any difficulty performing the following activities: 12/06/2016  Hearing? N  Vision? N  Difficulty concentrating or making decisions? N  Walking or climbing stairs? N  Dressing or bathing? N  Doing errands, shopping? N  Preparing Food and eating ? N  Using the Toilet? N  In the past six months, have you accidently leaked urine? N  Do you have problems with loss of bowel control? N  Managing your Medications? N  Managing your Finances? N  Housekeeping or managing your Housekeeping? N  Some recent data might be hidden    Patient Care Team: Tonia Ghent, MD as PCP - General (Family Medicine)   Assessment:     Hearing Screening   125Hz  250Hz  500Hz  1000Hz  2000Hz  3000Hz  4000Hz  6000Hz  8000Hz   Right ear:   40 0 0  0    Left ear:   0 0 0  0      Visual Acuity Screening   Right eye Left eye Both eyes  Without correction:     With correction: 20/15-1 20/15-1 20/15-1    Exercise Activities and Dietary recommendations Current Exercise Habits: Home exercise routine, Type of exercise: walking, Time (Minutes): 20, Frequency (Times/Week): 2, Weekly Exercise (Minutes/Week): 40, Intensity: Mild, Exercise  limited by: None identified  Goals    . Increase physical activity          When tolerated, I will resume walking for 20 min twice weekly.       Fall Risk Fall Risk  12/06/2016 03/09/2014  Falls in the past year? Yes No  Comment pt slipped on wet grass and fractured left foot -  Number falls in past yr: 1 -  Injury with Fall? Yes -   Depression Screen PHQ 2/9 Scores 12/06/2016 03/09/2014  PHQ - 2 Score 0 0  PHQ- 9 Score 0 -    Cognitive Function MMSE - Mini Mental State Exam 12/06/2016  Orientation to time 5  Orientation to Place 5  Registration 3  Attention/ Calculation 0  Recall 3  Language- name 2 objects 0  Language- repeat 1  Language- follow 3 step command 3  Language- read & follow direction 0  Write a sentence 0  Copy design 0  Total score 20       PLEASE  NOTE: A Mini-Cog screen was completed. Maximum score is 20. A value of 0 denotes this part of [place] MMSE was not completed or the patient failed this part of the Mini-Cog screening.   Mini-Cog Screening Orientation to Time - Max 5 pts Orientation to Place - Max 5 pts Registration - Max 3 pts Recall - Max 3 pts Language Repeat - Max 1 pts Language Follow 3 Step Command - Max 3 pts   Immunization History  Administered Date(s) Administered  . Influenza-Unspecified 12/03/2013  . Pneumococcal Conjugate-13 06/23/2015  . Pneumococcal Polysaccharide-23 06/07/2011  . Td 03/06/1995, 06/07/2011   Screening Tests Health Maintenance  Topic Date Due  . INFLUENZA VACCINE  06/02/2017 (Originally 10/03/2016)  . COLONOSCOPY  07/03/2020  . TETANUS/TDAP  06/06/2021  . PNA vac Low Risk Adult  Completed      Plan:     I have personally reviewed and addressed the Medicare Annual Wellness questionnaire and have noted the following in the patient's chart:  A. Medical and social history B. Use of alcohol, tobacco or illicit drugs  C. Current medications and supplements D. Functional ability and status E.  Nutritional status F.  Physical activity G. Advance directives H. List of other physicians I.  Hospitalizations, surgeries, and ER visits in previous 12 months J.  [place] to include hearing, vision, cognitive, depression L. Referrals and appointments - none  In addition, I have reviewed and discussed with patient certain preventive protocols, quality metrics, and best practice recommendations. A written personalized care plan for preventive services as well as general preventive health recommendations were provided to patient.  See attached scanned questionnaire for additional information.   Signed,   Lindell Noe, MHA, BS, LPN Health Coach

## 2016-12-06 NOTE — Progress Notes (Signed)
Pre visit review using our clinic review tool, if applicable. No additional management support is needed unless otherwise documented below in the visit note. 

## 2016-12-06 NOTE — Progress Notes (Signed)
PCP notes:   Health maintenance:  Flu vaccine - patient declined  Abnormal screenings:   Fall risk - hx of fall with injury and medical treatment Hearing - failed  Hearing Screening   125Hz  250Hz  500Hz  1000Hz  2000Hz  3000Hz  4000Hz  6000Hz  8000Hz   Right ear:   40 0 0  0    Left ear:   0 0 0  0     Patient concerns:   None  Nurse concerns:  None  Next PCP appt:   12/13/16 @ 1415  I reviewed health advisor's note, was available for consultation on the day of service listed in this note, and agree with documentation and plan. Elsie Stain, MD.

## 2016-12-06 NOTE — Patient Instructions (Signed)
Steven Hall , Thank you for taking time to come for your Medicare Wellness Visit. I appreciate your ongoing commitment to your health goals. Please review the following plan we discussed and let me know if I can assist you in the future.   These are the goals we discussed: Goals    . Increase physical activity          When tolerated, I will resume walking for 20 min twice weekly.        This is a list of the screening recommended for you and due dates:  Health Maintenance  Topic Date Due  . Flu Shot  06/02/2017*  . Colon Cancer Screening  07/03/2020  . Tetanus Vaccine  06/06/2021  . Pneumonia vaccines  Completed  *Topic was postponed. The date shown is not the original due date.   Preventive Care for Adults  A healthy lifestyle and preventive care can promote health and wellness. Preventive health guidelines for adults include the following key practices.  . A routine yearly physical is a good way to check with your health care provider about your health and preventive screening. It is a chance to share any concerns and updates on your health and to receive a thorough exam.  . Visit your dentist for a routine exam and preventive care every 6 months. Brush your teeth twice a day and floss once a day. Good oral hygiene prevents tooth decay and gum disease.  . The frequency of eye exams is based on your age, health, family medical history, use  of contact lenses, and other factors. Follow your health care provider's ecommendations for frequency of eye exams.  . Eat a healthy diet. Foods like vegetables, fruits, whole grains, low-fat dairy products, and lean protein foods contain the nutrients you need without too many calories. Decrease your intake of foods high in solid fats, added sugars, and salt. Eat the right amount of calories for you. Get information about a proper diet from your health care provider, if necessary.  . Regular physical exercise is one of the most important things  you can do for your health. Most adults should get at least 150 minutes of moderate-intensity exercise (any activity that increases your heart rate and causes you to sweat) each week. In addition, most adults need muscle-strengthening exercises on 2 or more days a week.  Silver Sneakers may be a benefit available to you. To determine eligibility, you may visit the website: www.silversneakers.com or contact program at (614)246-5510 Mon-Fri between 8AM-8PM.   . Maintain a healthy weight. The body mass index (BMI) is a screening tool to identify possible weight problems. It provides an estimate of body fat based on height and weight. Your health care provider can find your BMI and can help you achieve or maintain a healthy weight.   For adults 20 years and older: ? A BMI below 18.5 is considered underweight. ? A BMI of 18.5 to 24.9 is normal. ? A BMI of 25 to 29.9 is considered overweight. ? A BMI of 30 and above is considered obese.   . Maintain normal blood lipids and cholesterol levels by exercising and minimizing your intake of saturated fat. Eat a balanced diet with plenty of fruit and vegetables. Blood tests for lipids and cholesterol should begin at age 80 and be repeated every 5 years. If your lipid or cholesterol levels are high, you are over 50, or you are at high risk for heart disease, you may need your cholesterol levels  checked more frequently. Ongoing high lipid and cholesterol levels should be treated with medicines if diet and exercise are not working.  . If you smoke, find out from your health care provider how to quit. If you do not use tobacco, please do not start.  . If you choose to drink alcohol, please do not consume more than 2 drinks per day. One drink is considered to be 12 ounces (355 mL) of beer, 5 ounces (148 mL) of wine, or 1.5 ounces (44 mL) of liquor.  . If you are 71-50 years old, ask your health care provider if you should take aspirin to prevent strokes.  . Use  sunscreen. Apply sunscreen liberally and repeatedly throughout the day. You should seek shade when your shadow is shorter than you. Protect yourself by wearing long sleeves, pants, a wide-brimmed hat, and sunglasses year round, whenever you are outdoors.  . Once a month, do a whole body skin exam, using a mirror to look at the skin on your back. Tell your health care provider of new moles, moles that have irregular borders, moles that are larger than a pencil eraser, or moles that have changed in shape or color.

## 2016-12-12 NOTE — Telephone Encounter (Signed)
Completed 12/06/16

## 2016-12-13 ENCOUNTER — Ambulatory Visit (INDEPENDENT_AMBULATORY_CARE_PROVIDER_SITE_OTHER): Payer: Medicare Other | Admitting: Family Medicine

## 2016-12-13 ENCOUNTER — Encounter: Payer: Self-pay | Admitting: Family Medicine

## 2016-12-13 VITALS — BP 126/80 | HR 69 | Temp 97.6°F | Ht 67.0 in | Wt 168.0 lb

## 2016-12-13 DIAGNOSIS — E785 Hyperlipidemia, unspecified: Secondary | ICD-10-CM | POA: Diagnosis not present

## 2016-12-13 DIAGNOSIS — N401 Enlarged prostate with lower urinary tract symptoms: Secondary | ICD-10-CM | POA: Diagnosis not present

## 2016-12-13 DIAGNOSIS — R739 Hyperglycemia, unspecified: Secondary | ICD-10-CM | POA: Diagnosis not present

## 2016-12-13 DIAGNOSIS — Z7189 Other specified counseling: Secondary | ICD-10-CM

## 2016-12-13 DIAGNOSIS — E875 Hyperkalemia: Secondary | ICD-10-CM

## 2016-12-13 DIAGNOSIS — Z0000 Encounter for general adult medical examination without abnormal findings: Secondary | ICD-10-CM

## 2016-12-13 MED ORDER — MELOXICAM 15 MG PO TABS: ORAL_TABLET | ORAL | 3 refills | Status: DC

## 2016-12-13 MED ORDER — FINASTERIDE 5 MG PO TABS: 5.0000 mg | ORAL_TABLET | Freq: Every day | ORAL | 3 refills | Status: DC

## 2016-12-13 MED ORDER — TAMSULOSIN HCL 0.4 MG PO CAPS: 0.4000 mg | ORAL_CAPSULE | Freq: Every day | ORAL | 3 refills | Status: DC

## 2016-12-13 MED ORDER — ATORVASTATIN CALCIUM 10 MG PO TABS: 10.0000 mg | ORAL_TABLET | Freq: Every day | ORAL | 3 refills | Status: DC

## 2016-12-13 NOTE — Patient Instructions (Addendum)
Check with your insurance to see if they will cover the shingrix shot. Go to the lab on the way out.  We'll contact you with your lab report. Take care.  Glad to see you.  Update me as needed.

## 2016-12-13 NOTE — Progress Notes (Signed)
Fall cautions d/w pt.  His foot is sore but getting better.  Out of the boot now.   Hearing screening d/w pt.  Declined hearing aids.   Shingles shot d/w pt.   Flu shot declined, d/w pt.  Colonoscopy 2012 PSA wnl but true number is double, ie ~0.6.  D/w pt. Voiding well with both meds. He failed trial of meds in the past.   Advance directive- wife designated if patient were incapacitated.  He is applying for a recert for concealed carry.  Discussed.  There is no medical reason that would prevent him from safely handling a firearm.  No SI/HI.    Elevated Cholesterol: Using medications without problems: yes Muscle aches: no Diet compliance: encouraged.  Exercise: encouraged.  Limited by foot pain.    Hyperglycemia.  D/w pt about labs.  D/w pt about low carb diet.    He had prev used tramadol for foot pain.  See allergy list.  D/w pt.    PMH and SH reviewed  Meds, vitals, and allergies reviewed.   ROS: Per HPI unless specifically indicated in ROS section   GEN: nad, alert and oriented HEENT: mucous membranes moist NECK: supple w/o LA CV: rrr. PULM: ctab, no inc wob ABD: soft, +bs EXT: no edema SKIN: no acute rash Able to bear weight but with limp.

## 2016-12-14 DIAGNOSIS — E875 Hyperkalemia: Secondary | ICD-10-CM | POA: Insufficient documentation

## 2016-12-14 DIAGNOSIS — Z0000 Encounter for general adult medical examination without abnormal findings: Secondary | ICD-10-CM | POA: Insufficient documentation

## 2016-12-14 LAB — BASIC METABOLIC PANEL
BUN: 18 mg/dL (ref 6–23)
CALCIUM: 9.3 mg/dL (ref 8.4–10.5)
CO2: 27 meq/L (ref 19–32)
Chloride: 104 mEq/L (ref 96–112)
Creatinine, Ser: 1.17 mg/dL (ref 0.40–1.50)
GFR: 64.51 mL/min (ref >60.00)
Glucose, Bld: 103 mg/dL — ABNORMAL HIGH (ref 70–99)
POTASSIUM: 4.8 meq/L (ref 3.5–5.1)
SODIUM: 139 meq/L (ref 135–145)

## 2016-12-14 NOTE — Assessment & Plan Note (Signed)
PSA wnl but true number is double, ie ~0.6.  D/w pt. Voiding well with both meds. He failed trial of meds in the past.   Continue as is. He agrees.

## 2016-12-14 NOTE — Assessment & Plan Note (Signed)
Fall cautions d/w pt.  His foot is sore but getting better.  Out of the boot now.   Hearing screening d/w pt.  Declined hearing aids.   Shingles shot d/w pt.   Flu shot declined, d/w pt.  Colonoscopy 2012 PSA wnl but true number is double, ie ~0.6.  D/w pt. Voiding well with both meds. He failed trial of meds in the past.   Advance directive- wife designated if patient were incapacitated.  He is applying for a recert for concealed carry.  Discussed.  There is no medical reason that would prevent him from safely handling a firearm.  No SI/HI.

## 2016-12-14 NOTE — Assessment & Plan Note (Signed)
Labs discussed with patient. Continue work on diet and exercise as tolerated. Continue statin. He agrees.

## 2016-12-14 NOTE — Assessment & Plan Note (Signed)
Advance directive- wife designated if patient were incapacitated.  

## 2016-12-14 NOTE — Assessment & Plan Note (Signed)
Noted on labs with mild increase in creatinine. Recheck labs today. Discussed with patient.

## 2016-12-14 NOTE — Assessment & Plan Note (Signed)
Labs discussed with patient. Needs to cut back on carbohydrate intake. Low-carb diet discussed with patient. He understood. We can check episodically. >25 minutes spent in face to face time with patient, >50% spent in counselling or coordination of care.

## 2017-12-10 ENCOUNTER — Other Ambulatory Visit: Payer: Self-pay | Admitting: Family Medicine

## 2017-12-12 ENCOUNTER — Other Ambulatory Visit: Payer: Self-pay | Admitting: Family Medicine

## 2017-12-12 ENCOUNTER — Ambulatory Visit (INDEPENDENT_AMBULATORY_CARE_PROVIDER_SITE_OTHER): Payer: Medicare Other

## 2017-12-12 VITALS — BP 124/80 | HR 68 | Temp 98.0°F | Ht 65.5 in | Wt 169.0 lb

## 2017-12-12 DIAGNOSIS — N401 Enlarged prostate with lower urinary tract symptoms: Secondary | ICD-10-CM

## 2017-12-12 DIAGNOSIS — R739 Hyperglycemia, unspecified: Secondary | ICD-10-CM | POA: Diagnosis not present

## 2017-12-12 DIAGNOSIS — Z0000 Encounter for general adult medical examination without abnormal findings: Secondary | ICD-10-CM | POA: Diagnosis not present

## 2017-12-12 DIAGNOSIS — E785 Hyperlipidemia, unspecified: Secondary | ICD-10-CM

## 2017-12-12 LAB — COMPREHENSIVE METABOLIC PANEL
ALT: 15 U/L (ref 0–53)
AST: 20 U/L (ref 0–37)
Albumin: 4.3 g/dL (ref 3.5–5.2)
Alkaline Phosphatase: 46 U/L (ref 39–117)
BILIRUBIN TOTAL: 0.5 mg/dL (ref 0.2–1.2)
BUN: 24 mg/dL — ABNORMAL HIGH (ref 6–23)
CHLORIDE: 102 meq/L (ref 96–112)
CO2: 29 meq/L (ref 19–32)
CREATININE: 1.28 mg/dL (ref 0.40–1.50)
Calcium: 9.8 mg/dL (ref 8.4–10.5)
GFR: 58 mL/min — ABNORMAL LOW (ref >60.00)
GLUCOSE: 182 mg/dL — AB (ref 70–99)
Potassium: 4.5 mEq/L (ref 3.5–5.1)
SODIUM: 138 meq/L (ref 135–145)
Total Protein: 7 g/dL (ref 6.0–8.3)

## 2017-12-12 LAB — HEMOGLOBIN A1C: HEMOGLOBIN A1C: 6.5 % (ref 4.6–6.5)

## 2017-12-12 LAB — LIPID PANEL
CHOL/HDL RATIO: 4
Cholesterol: 183 mg/dL (ref 0–200)
HDL: 50.7 mg/dL (ref >39.00)
NONHDL: 132.28
Triglycerides: 249 mg/dL — ABNORMAL HIGH (ref 0.0–149.0)
VLDL: 49.8 mg/dL — ABNORMAL HIGH (ref 0.0–40.0)

## 2017-12-12 LAB — PSA, MEDICARE: PSA: 0.31 ng/mL (ref 0.10–4.00)

## 2017-12-12 LAB — LDL CHOLESTEROL, DIRECT: Direct LDL: 108 mg/dL

## 2017-12-12 NOTE — Progress Notes (Signed)
PCP notes:   Health maintenance:  Flu vaccine - pt declined  Abnormal screenings:   Hearing - failed  Hearing Screening   125Hz  250Hz  500Hz  1000Hz  2000Hz  3000Hz  4000Hz  6000Hz  8000Hz   Right ear:   40 40 0  0    Left ear:   40 40 0  40     Patient concerns:   None  Nurse concerns:  None  Next PCP appt:   12/17/17 @ 0945

## 2017-12-12 NOTE — Patient Instructions (Signed)
Mr. Steven Hall , Thank you for taking time to come for your Medicare Wellness Visit. I appreciate your ongoing commitment to your health goals. Please review the following plan we discussed and let me know if I can assist you in the future.   These are the goals we discussed: Goals    . Increase physical activity     When tolerated, I will resume walking for 20 min twice weekly.        This is a list of the screening recommended for you and due dates:  Health Maintenance  Topic Date Due  . Flu Shot  06/04/2018*  . Tetanus Vaccine  06/06/2021  . Pneumonia vaccines  Completed  *Topic was postponed. The date shown is not the original due date.   Preventive Care for Adults  A healthy lifestyle and preventive care can promote health and wellness. Preventive health guidelines for adults include the following key practices.  . A routine yearly physical is a good way to check with your health care provider about your health and preventive screening. It is a chance to share any concerns and updates on your health and to receive a thorough exam.  . Visit your dentist for a routine exam and preventive care every 6 months. Brush your teeth twice a day and floss once a day. Good oral hygiene prevents tooth decay and gum disease.  . The frequency of eye exams is based on your age, health, family medical history, use  of contact lenses, and other factors. Follow your health care provider's recommendations for frequency of eye exams.  . Eat a healthy diet. Foods like vegetables, fruits, whole grains, low-fat dairy products, and lean protein foods contain the nutrients you need without too many calories. Decrease your intake of foods high in solid fats, added sugars, and salt. Eat the right amount of calories for you. Get information about a proper diet from your health care provider, if necessary.  . Regular physical exercise is one of the most important things you can do for your health. Most adults  should get at least 150 minutes of moderate-intensity exercise (any activity that increases your heart rate and causes you to sweat) each week. In addition, most adults need muscle-strengthening exercises on 2 or more days a week.  Silver Sneakers may be a benefit available to you. To determine eligibility, you may visit the website: www.silversneakers.com or contact program at [phone] Mon-Fri between 8AM-8PM.   . Maintain a healthy weight. The body mass index (BMI) is a screening tool to identify possible weight problems. It provides an estimate of body fat based on height and weight. Your health care provider can find your BMI and can help you achieve or maintain a healthy weight.   For adults 20 years and older: ? A BMI below 18.5 is considered underweight. ? A BMI of 18.5 to 24.9 is normal. ? A BMI of 25 to 29.9 is considered overweight. ? A BMI of 30 and above is considered obese.   . Maintain normal blood lipids and cholesterol levels by exercising and minimizing your intake of saturated fat. Eat a balanced diet with plenty of fruit and vegetables. Blood tests for lipids and cholesterol should begin at age 32 and be repeated every 5 years. If your lipid or cholesterol levels are high, you are over 50, or you are at high risk for heart disease, you may need your cholesterol levels checked more frequently. Ongoing high lipid and cholesterol levels should be treated  with medicines if diet and exercise are not working.  . If you smoke, find out from your health care provider how to quit. If you do not use tobacco, please do not start.  . If you choose to drink alcohol, please do not consume more than 2 drinks per day. One drink is considered to be 12 ounces (355 mL) of beer, 5 ounces (148 mL) of wine, or 1.5 ounces (44 mL) of liquor.  . If you are 65-23 years old, ask your health care provider if you should take aspirin to prevent strokes.  . Use sunscreen. Apply sunscreen liberally and  repeatedly throughout the day. You should seek shade when your shadow is shorter than you. Protect yourself by wearing long sleeves, pants, a wide-brimmed hat, and sunglasses year round, whenever you are outdoors.  . Once a month, do a whole body skin exam, using a mirror to look at the skin on your back. Tell your health care provider of new moles, moles that have irregular borders, moles that are larger than a pencil eraser, or moles that have changed in shape or color.

## 2017-12-12 NOTE — Progress Notes (Signed)
Subjective:   Steven Hall is a 77 y.o. male who presents for Medicare Annual/Subsequent preventive examination.  Review of Systems:  N/A Cardiac Risk Factors include: advanced age (>62men, >18 women);male gender;dyslipidemia     Objective:    Vitals: BP 124/80 (BP Location: Right Arm, Patient Position: Sitting, Cuff Size: Normal)   Pulse 68   Temp 98 F (36.7 C) (Oral)   Ht 5' 5.5" (1.664 m) Comment: no shoes  Wt 169 lb (76.7 kg)   SpO2 97%   BMI 27.70 kg/m   Body mass index is 27.7 kg/m.  Advanced Directives 12/12/2017 12/06/2016 10/26/2016  Does Patient Have a Medical Advance Directive? No No No  Would patient like information on creating a medical advance directive? Yes (MAU/Ambulatory/Procedural Areas - Information given) Yes (MAU/Ambulatory/Procedural Areas - Information given) No - Patient declined    Tobacco Social History   Tobacco Use  Smoking Status Former Smoker  Smokeless Tobacco Former Systems developer  . Quit date: 03/05/2001     Counseling given: No   Clinical Intake:  Pre-visit preparation completed: Yes  Pain : No/denies pain Pain Score: 2      Nutritional Status: BMI 25 -29 Overweight Nutritional Risks: None Diabetes: No  How often do you need to have someone help you when you read instructions, pamphlets, or other written materials from your doctor or pharmacy?: 1 - Never What is the last grade level you completed in school?: 12th grade + some college courses  Interpreter Needed?: No  Comments: pt lives with spouse Information entered by :: LPinson, LPN  Past Medical History:  Diagnosis Date  . Allergy   . Arthritis    B hands  . Back pain    lumbar disc disease  . BPH (benign prostatic hyperplasia)   . ED (erectile dysfunction)   . Hyperlipidemia   . Inguinal hernia   . Vertigo    Past Surgical History:  Procedure Laterality Date  . TONSILLECTOMY  1968   Family History  Problem Relation Age of Onset  . Coronary artery disease  Brother 34  . Diabetes Mother   . Hypertension Mother   . Heart disease Father   . Colon cancer Neg Hx   . Prostate cancer Neg Hx    Social History   Socioeconomic History  . Marital status: Single    Spouse name: Not on file  . Number of children: Not on file  . Years of education: Not on file  . Highest education level: Not on file  Occupational History  . 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 Financial resource strain: Not on file  . Food insecurity:    Worry: Not on file    Inability: Not on file  . Transportation needs:    Medical: Not on file    Non-medical: Not on file  Tobacco Use  . Smoking status: Former Research scientist (life sciences)  . Smokeless tobacco: Former Systems developer    Quit date: 03/05/2001  Substance and Sexual Activity  . Alcohol use: Yes    Comment: occ  . Drug use: No  . Sexual activity: Not on file  Lifestyle  . Physical activity:    Days per week: Not on file    Minutes per session: Not on file  . Stress: Not on file  Relationships  . Social connections:    Talks on phone: Not on file    Gets together: Not on file    Attends religious service: Not  on file    Active member of club or organization: Not on file    Attends meetings of clubs or organizations: Not on file    Relationship status: Not on file  Other Topics Concern  . Not on file  Social History Narrative   From [place]   Retired from [employer] company   Married 2012, but together 17 years prev   1 son from a prev relationship   [employer] '65-'67, deployed to [place]    Outpatient Encounter Medications as of 12/12/2017  Medication Sig  . atorvastatin (LIPITOR) 10 MG tablet TAKE 1 TABLET AT BEDTIME  . finasteride (PROSCAR) 5 MG tablet Take 1 tablet (5 mg total) by mouth daily.  Marland Kitchen loratadine (CLARITIN) 10 MG tablet Take 10 mg by mouth as needed.    . meloxicam (MOBIC) 15 MG tablet TAKE 1 TABLET DAILY AFTER SUPPER  . tamsulosin (FLOMAX) 0.4 MG CAPS capsule Take 1 capsule (0.4  mg total) by mouth daily.   No facility-administered encounter medications on file as of 12/12/2017.     Activities of Daily Living In your present state of health, do you have any difficulty performing the following activities: 12/12/2017  Hearing? N  Vision? N  Difficulty concentrating or making decisions? N  Walking or climbing stairs? N  Dressing or bathing? N  Doing errands, shopping? N  Preparing Food and eating ? N  Using the Toilet? N  In the past six months, have you accidently leaked urine? N  Do you have problems with loss of bowel control? N  Managing your Medications? N  Managing your Finances? N  Housekeeping or managing your Housekeeping? N  Some recent data might be hidden    Patient Care Team: Tonia Ghent, MD as PCP - General (Family Medicine)   Assessment:   This is a routine wellness examination for [place].   Hearing Screening   125Hz  250Hz  500Hz  1000Hz  2000Hz  3000Hz  4000Hz  6000Hz  8000Hz   Right ear:   40 40 0  0    Left ear:   40 40 0  40    Vision Screening Comments: Vision exam on 12/10/2017 with Dr. Marvel Plan    Exercise Activities and Dietary recommendations Current Exercise Habits: The patient does not participate in regular exercise at present, Exercise limited by: None identified  Goals    . Increase physical activity     When tolerated, I will resume walking for 20 min twice weekly.        Fall Risk Fall Risk  12/12/2017 12/06/2016 03/09/2014  Falls in the past year? No Yes No  Comment - pt slipped on wet grass and fractured left foot -  Number falls in past yr: - 1 -  Injury with Fall? - Yes -   Depression Screen PHQ 2/9 Scores 12/12/2017 12/06/2016 03/09/2014  PHQ - 2 Score 0 0 0  PHQ- 9 Score 0 0 -    Cognitive Function MMSE - Mini Mental State Exam 12/12/2017 12/06/2016  Orientation to time 5 5  Orientation to Place 5 5  Registration 3 3  Attention/ Calculation 0 0  Recall 3 3  Language- name 2 objects 0 0  Language-  repeat 1 1  Language- follow 3 step command 3 3  Language- read & follow direction 0 0  Write a sentence 0 0  Copy design 0 0  Total score 20 20     PLEASE NOTE: A Mini-Cog screen was completed. Maximum score is 20. A value  of 0 denotes this part of Folstein MMSE was not completed or the patient failed this part of the Mini-Cog screening.   Mini-Cog Screening Orientation to Time - Max 5 pts Orientation to Place - Max 5 pts Registration - Max 3 pts Recall - Max 3 pts Language Repeat - Max 1 pts Language Follow 3 Step Command - Max 3 pts     Immunization History  Administered Date(s) Administered  . Influenza-Unspecified 12/03/2013  . Pneumococcal Conjugate-13 06/23/2015  . Pneumococcal Polysaccharide-23 06/07/2011  . Td 03/06/1995, 06/07/2011    Screening Tests Health Maintenance  Topic Date Due  . INFLUENZA VACCINE  06/04/2018 (Originally 10/03/2017)  . TETANUS/TDAP  06/06/2021  . PNA vac Low Risk Adult  Completed       Plan:    I have personally reviewed, addressed, and noted the following in the patient's chart:  A. Medical and social history B. Use of alcohol, tobacco or illicit drugs  C. Current medications and supplements D. Functional ability and status E.  Nutritional status F.  Physical activity G. Advance directives H. List of other physicians I.  Hospitalizations, surgeries, and ER visits in previous 12 months J.  [place] to include hearing, vision, cognitive, depression L. Referrals and appointments - none  In addition, I have reviewed and discussed with patient certain preventive protocols, quality metrics, and best practice recommendations. A written personalized care plan for preventive services as well as general preventive health recommendations were provided to patient.  See attached scanned questionnaire for additional information.   Signed,   Lindell Noe, MHA, BS, LPN Health Coach

## 2017-12-17 ENCOUNTER — Encounter: Payer: Self-pay | Admitting: Family Medicine

## 2017-12-17 ENCOUNTER — Ambulatory Visit (INDEPENDENT_AMBULATORY_CARE_PROVIDER_SITE_OTHER): Payer: Medicare Other | Admitting: Family Medicine

## 2017-12-17 VITALS — BP 124/80 | HR 68 | Temp 98.0°F | Ht 65.5 in | Wt 169.0 lb

## 2017-12-17 DIAGNOSIS — Z7189 Other specified counseling: Secondary | ICD-10-CM

## 2017-12-17 DIAGNOSIS — Z0000 Encounter for general adult medical examination without abnormal findings: Secondary | ICD-10-CM

## 2017-12-17 DIAGNOSIS — E119 Type 2 diabetes mellitus without complications: Secondary | ICD-10-CM

## 2017-12-17 DIAGNOSIS — M129 Arthropathy, unspecified: Secondary | ICD-10-CM | POA: Diagnosis not present

## 2017-12-17 DIAGNOSIS — E785 Hyperlipidemia, unspecified: Secondary | ICD-10-CM | POA: Diagnosis not present

## 2017-12-17 MED ORDER — TAMSULOSIN HCL 0.4 MG PO CAPS: 0.4000 mg | ORAL_CAPSULE | Freq: Every day | ORAL | 3 refills | Status: DC

## 2017-12-17 MED ORDER — ATORVASTATIN CALCIUM 10 MG PO TABS: 10.0000 mg | ORAL_TABLET | Freq: Every day | ORAL | 4 refills | Status: DC

## 2017-12-17 MED ORDER — FINASTERIDE 5 MG PO TABS: 5.0000 mg | ORAL_TABLET | Freq: Every day | ORAL | 3 refills | Status: DC

## 2017-12-17 MED ORDER — MELOXICAM 15 MG PO TABS: ORAL_TABLET | ORAL | 3 refills | Status: DC

## 2017-12-17 NOTE — Patient Instructions (Addendum)
Recheck in about 3-4 months.  The only lab you need to have done for your next diabetic visit is an A1c.  We can do this with a fingerstick test at the office visit.  You do not need a lab visit ahead of time for this.  It does not matter if you are fasting when the lab is done.  Try to cut back on potatoes and white bread.   Use the eat right diet.  Take care.  Glad to see you.  Update me as needed.

## 2017-12-17 NOTE — Progress Notes (Signed)
Diabetes:  No meds.  Hypoglycemic episodes: not checked, no sx Hyperglycemic episodes: not checked, no sx Feet problems:no Blood Sugars averaging: not checked.   New dx.  Path/phys d/w pt.   Elevated Cholesterol: Using medications without problems: yes Muscle aches: not likely from statin. Diet compliance: encouraged.  Exercise: encouraged.   Still with B PIP joint pain, in spite of meloxicam.  Worse off med.  D/w pt about options.  Pain at rest, throbs.  He can put up with it as is, and update me as needed.    Declined hearing aids.   PNA up to date.   Shingles shot out of stock.   Tetanus 2013 Flu shot declined, d/w pt.  Colonoscopy 2012 PSA wnl but true number is double, ie ~0.6.  D/w pt. Voiding well with both meds. He failed trial of meds in the past.   Advance directive- wife designated if patient were incapacitated.   PMH and SH reviewed Meds, vitals, and allergies reviewed.   ROS: Per HPI unless specifically indicated in ROS section   GEN: nad, alert and oriented HEENT: mucous membranes moist NECK: supple w/o LA CV: rrr. PULM: ctab, no inc wob ABD: soft, +bs EXT: no edema SKIN: no acute rash Chronic IP joint changes noted.

## 2017-12-19 DIAGNOSIS — E119 Type 2 diabetes mellitus without complications: Secondary | ICD-10-CM | POA: Insufficient documentation

## 2017-12-19 NOTE — Assessment & Plan Note (Signed)
Continue statin.  Discussed diet and exercise.

## 2017-12-19 NOTE — Assessment & Plan Note (Signed)
Diagnosis, labs, pathophysiology discussed with patient.  Low-carb handout given.  Needs work on diet and exercise.  Recheck A1c in about 3-4 months.  See after visit summary.  No meds needed at this point.

## 2017-12-19 NOTE — Assessment & Plan Note (Signed)
Declined hearing aids.   PNA up to date.   Shingles shot out of stock.   Tetanus 2013 Flu shot declined, d/w pt.  Colonoscopy 2012 PSA wnl but true number is double, ie ~0.6.  D/w pt. Voiding well with both meds. He failed trial of meds in the past.   Advance directive- wife designated if patient were incapacitated.

## 2017-12-19 NOTE — Assessment & Plan Note (Signed)
He can put up with his symptoms as is.  He can update me as needed.  We can refer to the hand clinic if worse.  Discussed.

## 2017-12-19 NOTE — Assessment & Plan Note (Signed)
Advance directive- wife designated if patient were incapacitated.  

## 2018-01-11 NOTE — Progress Notes (Signed)
I reviewed health advisor's note, was available for consultation, and agree with documentation and plan.  

## 2018-12-18 ENCOUNTER — Ambulatory Visit: Payer: Medicare Other

## 2018-12-18 ENCOUNTER — Ambulatory Visit (INDEPENDENT_AMBULATORY_CARE_PROVIDER_SITE_OTHER): Payer: Medicare Other

## 2018-12-18 ENCOUNTER — Other Ambulatory Visit: Payer: Self-pay | Admitting: Family Medicine

## 2018-12-18 DIAGNOSIS — Z0000 Encounter for general adult medical examination without abnormal findings: Secondary | ICD-10-CM

## 2018-12-18 NOTE — Progress Notes (Signed)
Subjective:   Steven Hall is a 78 y.o. male who presents for Medicare Annual/Subsequent preventive examination.  Review of Systems: N/A   This visit is being conducted through telemedicine via telephone at the nurse health advisor's home address due to the COVID-19 pandemic. This patient has given me verbal consent via doximity to conduct this visit, patient states they are participating from their home address. Some vital signs may be absent or patient reported.    Patient identification: identified by name, DOB, and current address  Patient and myself on the telephone call. There is no referral for this visit.  Cardiac Risk Factors include: advanced age (>61men, >85 women);diabetes mellitus;dyslipidemia;male gender     Objective:    Vitals: There were no vitals taken for this visit.  There is no height or weight on file to calculate BMI.  Advanced Directives 12/18/2018 12/12/2017 12/06/2016 10/26/2016  Does Patient Have a Medical Advance Directive? No No No No  Would patient like information on creating a medical advance directive? No - Patient declined Yes (MAU/Ambulatory/Procedural Areas - Information given) Yes (MAU/Ambulatory/Procedural Areas - Information given) No - Patient declined    Tobacco Social History   Tobacco Use  Smoking Status Former Smoker  Smokeless Tobacco Former Systems developer  . Quit date: 03/05/2001     Counseling given: Not Answered   Clinical Intake:  Pre-visit preparation completed: Yes  Pain : No/denies pain     Nutritional Risks: None Diabetes: No  How often do you need to have someone help you when you read instructions, pamphlets, or other written materials from your doctor or pharmacy?: 1 - Never What is the last grade level you completed in school?: some college  Interpreter Needed?: No  Information entered by :: CJohnson, LPN  Past Medical History:  Diagnosis Date  . Allergy   . Arthritis    B hands  . Back pain    lumbar disc  disease  . BPH (benign prostatic hyperplasia)   . ED (erectile dysfunction)   . Hyperlipidemia   . Inguinal hernia   . Vertigo    Past Surgical History:  Procedure Laterality Date  . TONSILLECTOMY  1968   Family History  Problem Relation Age of Onset  . Coronary artery disease Brother 19  . Diabetes Mother   . Hypertension Mother   . Heart disease Father   . Colon cancer Neg Hx   . Prostate cancer Neg Hx    Social History   Socioeconomic History  . Marital status: Single    Spouse name: Not on file  . Number of children: Not on file  . Years of education: Not on file  . Highest education level: Not on file  Occupational History  . 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 Financial resource strain: Not hard at all  . Food insecurity    Worry: Never true    Inability: Never true  . Transportation needs    Medical: No    Non-medical: No  Tobacco Use  . Smoking status: Former Research scientist (life sciences)  . Smokeless tobacco: Former Systems developer    Quit date: 03/05/2001  Substance and Sexual Activity  . Alcohol use: Yes    Comment: occ  . Drug use: No  . Sexual activity: Not on file  Lifestyle  . Physical activity    Days per week: 0 days    Minutes per session: 0 min  . Stress: Not at all  Relationships  .  Social Herbalist on phone: Not on file    Gets together: Not on file    Attends religious service: Not on file    Active member of club or organization: Not on file    Attends meetings of clubs or organizations: Not on file    Relationship status: Not on file  Other Topics Concern  . Not on file  Social History Narrative   From [place]   Retired from [employer] company   Married 2012, but together 1 years prev   1 son from a prev relationship   [employer] E5 '65-'67, deployed to [place]    Outpatient Encounter Medications as of 12/18/2018  Medication Sig  . atorvastatin (LIPITOR) 10 MG tablet Take 1 tablet (10 mg total) by mouth at bedtime.  .  finasteride (PROSCAR) 5 MG tablet TAKE 1 TABLET DAILY  . loratadine (CLARITIN) 10 MG tablet Take 10 mg by mouth as needed.    . meloxicam (MOBIC) 15 MG tablet TAKE 1 TABLET DAILY AFTER SUPPER  . tamsulosin (FLOMAX) 0.4 MG CAPS capsule TAKE 1 CAPSULE DAILY   No facility-administered encounter medications on file as of 12/18/2018.     Activities of Daily Living In your present state of health, do you have any difficulty performing the following activities: 12/18/2018  Hearing? N  Vision? N  Difficulty concentrating or making decisions? N  Walking or climbing stairs? N  Dressing or bathing? N  Doing errands, shopping? N  Preparing Food and eating ? N  Using the Toilet? N  In the past six months, have you accidently leaked urine? N  Do you have problems with loss of bowel control? N  Managing your Medications? N  Managing your Finances? N  Housekeeping or managing your Housekeeping? N  Some recent data might be hidden    Patient Care Team: Tonia Ghent, MD as PCP - General (Family Medicine)   Assessment:   This is a routine wellness examination for Steven Hall.  Exercise Activities and Dietary recommendations Current Exercise Habits: The patient does not participate in regular exercise at present, Exercise limited by: None identified  Goals    . Increase physical activity     When tolerated, I will resume walking for 20 min twice weekly.     . Patient Stated     12/18/2018, I want to maintain and continue medications as prescribed.        Fall Risk Fall Risk  12/18/2018 12/12/2017 12/06/2016 03/09/2014  Falls in the past year? 0 No Yes No  Comment - - pt slipped on wet grass and fractured left foot -  Number falls in past yr: - - 1 -  Injury with Fall? - - Yes -  Risk for fall due to : Medication side effect - - -  Follow up Falls evaluation completed;Falls prevention discussed - - -   Is the patient's home free of loose throw rugs in walkways, pet beds, electrical  cords, etc?   yes      Grab bars in the bathroom? yes      Handrails on the stairs?   yes      Adequate lighting?   yes  Timed Get Up and Go Performed: n/a  Depression Screen PHQ 2/9 Scores 12/18/2018 12/12/2017 12/06/2016 03/09/2014  PHQ - 2 Score 0 0 0 0  PHQ- 9 Score 0 0 0 -    Cognitive Function MMSE - Mini Mental State Exam 12/18/2018 12/12/2017 12/06/2016  Orientation  to time 5 5 5   Orientation to Place 5 5 5   Registration 3 3 3   Attention/ Calculation 3 0 0  Recall 3 3 3   Language- name 2 objects - 0 0  Language- repeat 1 1 1   Language- follow 3 step command - 3 3  Language- read & follow direction - 0 0  Write a sentence - 0 0  Copy design - 0 0  Total score - 20 20  Mini Cog  Mini-Cog screen was completed. Maximum score is 22. A value of 0 denotes this part of the MMSE was not completed or the patient failed this part of the Mini-Cog screening.      Immunization History  Administered Date(s) Administered  . Influenza-Unspecified 12/03/2013  . Pneumococcal Conjugate-13 06/23/2015  . Pneumococcal Polysaccharide-23 06/07/2011  . Td 03/06/1995, 06/07/2011    Qualifies for Shingles Vaccine? yes  Screening Tests Health Maintenance  Topic Date Due  . FOOT EXAM  06/15/1950  . OPHTHALMOLOGY EXAM  06/15/1950  . URINE MICROALBUMIN  05/22/2011  . HEMOGLOBIN A1C  06/13/2018  . INFLUENZA VACCINE  06/03/2019 (Originally 10/04/2018)  . TETANUS/TDAP  06/06/2021  . PNA vac Low Risk Adult  Completed   Cancer Screenings: Lung: Low Dose CT Chest recommended if Age 77-80 years, 30 pack-year currently smoking OR have quit w/in 15years. Patient does not qualify. Colorectal: no longer required  Additional Screenings:  Hepatitis C Screening: n/a      Plan:    Patient wants to maintain and continue medications as prescribed.   I have personally reviewed and noted the following in the patient's chart:   . Medical and social history . Use of alcohol, tobacco or illicit drugs   . Current medications and supplements . Functional ability and status . Nutritional status . Physical activity . Advanced directives . List of other physicians . Hospitalizations, surgeries, and ER visits in previous 12 months . Vitals . Screenings to include cognitive, depression, and falls . Referrals and appointments  In addition, I have reviewed and discussed with patient certain preventive protocols, quality metrics, and best practice recommendations. A written personalized care plan for preventive services as well as general preventive health recommendations were provided to patient.     Andrez Grime, LPN  [id number]

## 2018-12-18 NOTE — Telephone Encounter (Signed)
Last time Meloxicam was filled on 12/17/2017 #90 with 3 refills.  LOV 12/17/2017 for follow up  Next appointment on 12/23/2018 for AWV

## 2018-12-18 NOTE — Patient Instructions (Signed)
Steven Hall , Thank you for taking time to come for your Medicare Wellness Visit. I appreciate your ongoing commitment to your health goals. Please review the following plan we discussed and let me know if I can assist you in the future.   Screening recommendations/referrals: Colonoscopy: no longer required Recommended yearly ophthalmology/optometry visit for glaucoma screening and checkup Recommended yearly dental visit for hygiene and checkup  Vaccinations: Influenza vaccine: declined Pneumococcal vaccine: series completed Tdap vaccine: up to date, completed 06/07/2011 Shingles vaccine: declined    Advanced directives: Advance directive discussed with you today. Even though you declined this today please call our office should you change your mind and we can give you the proper paperwork for you to fill out.   Conditions/risks identified: diabetes, hyperlipidemia  Next appointment: 12/23/2018 @ 9:45 am   Preventive Care 65 Years and Older, Male Preventive care refers to lifestyle choices and visits with your health care provider that can promote health and wellness. What does preventive care include?  A yearly physical exam. This is also called an annual well check.  Dental exams once or twice a year.  Routine eye exams. Ask your health care provider how often you should have your eyes checked.  Personal lifestyle choices, including:  Daily care of your teeth and gums.  Regular physical activity.  Eating a healthy diet.  Avoiding tobacco and drug use.  Limiting alcohol use.  Practicing safe sex.  Taking low doses of aspirin every day.  Taking vitamin and mineral supplements as recommended by your health care provider. What happens during an annual well check? The services and screenings done by your health care provider during your annual well check will depend on your age, overall health, lifestyle risk factors, and family history of disease. Counseling  Your health  care provider may ask you questions about your:  Alcohol use.  Tobacco use.  Drug use.  Emotional well-being.  Home and relationship well-being.  Sexual activity.  Eating habits.  History of falls.  Memory and ability to understand (cognition).  Work and work Statistician. Screening  You may have the following tests or measurements:  Height, weight, and BMI.  Blood pressure.  Lipid and cholesterol levels. These may be checked every 5 years, or more frequently if you are over 57 years old.  Skin check.  Lung cancer screening. You may have this screening every year starting at age 95 if you have a 30-pack-year history of smoking and currently smoke or have quit within the past 15 years.  Fecal occult blood test (FOBT) of the stool. You may have this test every year starting at age 84.  Flexible sigmoidoscopy or colonoscopy. You may have a sigmoidoscopy every 5 years or a colonoscopy every 10 years starting at age 93.  Prostate cancer screening. Recommendations will vary depending on your family history and other risks.  Hepatitis C blood test.  Hepatitis B blood test.  Sexually transmitted disease (STD) testing.  Diabetes screening. This is done by checking your blood sugar (glucose) after you have not eaten for a while (fasting). You may have this done every 1-3 years.  Abdominal aortic aneurysm (AAA) screening. You may need this if you are a current or former smoker.  Osteoporosis. You may be screened starting at age 32 if you are at high risk. Talk with your health care provider about your test results, treatment options, and if necessary, the need for more tests. Vaccines  Your health care provider may recommend certain vaccines,  such as:  Influenza vaccine. This is recommended every year.  Tetanus, diphtheria, and acellular pertussis (Tdap, Td) vaccine. You may need a Td booster every 10 years.  Zoster vaccine. You may need this after age 59.   Pneumococcal 13-valent conjugate (PCV13) vaccine. One dose is recommended after age 5.  Pneumococcal polysaccharide (PPSV23) vaccine. One dose is recommended after age 74. Talk to your health care provider about which screenings and vaccines you need and how often you need them. This information is not intended to replace advice given to you by your health care provider. Make sure you discuss any questions you have with your health care provider. Document Released: 03/18/2015 Document Revised: 11/09/2015 Document Reviewed: 12/21/2014 Elsevier Interactive Patient Education  2017 [place] Prevention in the Home Falls can cause injuries. They can happen to people of all ages. There are many things you can do to make your home safe and to help prevent falls. What can I do on the outside of my home?  Regularly fix the edges of walkways and driveways and fix any cracks.  Remove anything that might make you trip as you walk through a door, such as a raised step or threshold.  Trim any bushes or trees on the path to your home.  Use bright outdoor lighting.  Clear any walking paths of anything that might make someone trip, such as rocks or tools.  Regularly check to see if handrails are loose or broken. Make sure that both sides of any steps have handrails.  Any raised decks and porches should have guardrails on the edges.  Have any leaves, snow, or ice cleared regularly.  Use sand or salt on walking paths during winter.  Clean up any spills in your garage right away. This includes oil or grease spills. What can I do in the bathroom?  Use night lights.  Install grab bars by the toilet and in the tub and shower. Do not use towel bars as grab bars.  Use non-skid mats or decals in the tub or shower.  If you need to sit down in the shower, use a plastic, non-slip stool.  Keep the floor dry. Clean up any water that spills on the floor as soon as it happens.  Remove soap  buildup in the tub or shower regularly.  Attach bath mats securely with double-sided non-slip rug tape.  Do not have throw rugs and other things on the floor that can make you trip. What can I do in the bedroom?  Use night lights.  Make sure that you have a light by your bed that is easy to reach.  Do not use any sheets or blankets that are too big for your bed. They should not hang down onto the floor.  Have a firm chair that has side arms. You can use this for support while you get dressed.  Do not have throw rugs and other things on the floor that can make you trip. What can I do in the kitchen?  Clean up any spills right away.  Avoid walking on wet floors.  Keep items that you use a lot in easy-to-reach places.  If you need to reach something above you, use a strong step stool that has a grab bar.  Keep electrical cords out of the way.  Do not use floor polish or wax that makes floors slippery. If you must use wax, use non-skid floor wax.  Do not have throw rugs and other things on  the floor that can make you trip. What can I do with my stairs?  Do not leave any items on the stairs.  Make sure that there are handrails on both sides of the stairs and use them. Fix handrails that are broken or loose. Make sure that handrails are as long as the stairways.  Check any carpeting to make sure that it is firmly attached to the stairs. Fix any carpet that is loose or worn.  Avoid having throw rugs at the top or bottom of the stairs. If you do have throw rugs, attach them to the floor with carpet tape.  Make sure that you have a light switch at the top of the stairs and the bottom of the stairs. If you do not have them, ask someone to add them for you. What else can I do to help prevent falls?  Wear shoes that:  Do not have high heels.  Have rubber bottoms.  Are comfortable and fit you well.  Are closed at the toe. Do not wear sandals.  If you use a stepladder:  Make  sure that it is fully opened. Do not climb a closed stepladder.  Make sure that both sides of the stepladder are locked into place.  Ask someone to hold it for you, if possible.  Clearly mark and make sure that you can see:  Any grab bars or handrails.  First and last steps.  Where the edge of each step is.  Use tools that help you move around (mobility aids) if they are needed. These include:  Canes.  Walkers.  Scooters.  Crutches.  Turn on the lights when you go into a dark area. Replace any light bulbs as soon as they burn out.  Set up your furniture so you have a clear path. Avoid moving your furniture around.  If any of your floors are uneven, fix them.  If there are any pets around you, be aware of where they are.  Review your medicines with your doctor. Some medicines can make you feel dizzy. This can increase your chance of falling. Ask your doctor what other things that you can do to help prevent falls. This information is not intended to replace advice given to you by your health care provider. Make sure you discuss any questions you have with your health care provider. Document Released: 12/16/2008 Document Revised: 07/28/2015 Document Reviewed: 03/26/2014 Elsevier Interactive Patient Education  2017 [employer].

## 2018-12-18 NOTE — Progress Notes (Signed)
PCP notes: none  Health Maintenance: Declined flu and shingrix vaccines. Patient has diabetic eye exam scheduled for next month.     Abnormal Screenings: none    Patient concerns: none    Nurse concerns: none    Next PCP appt.: 12/23/2018 @ 9:45 am

## 2018-12-19 ENCOUNTER — Other Ambulatory Visit (INDEPENDENT_AMBULATORY_CARE_PROVIDER_SITE_OTHER): Payer: Medicare Other

## 2018-12-19 ENCOUNTER — Other Ambulatory Visit: Payer: Self-pay | Admitting: Family Medicine

## 2018-12-19 DIAGNOSIS — E119 Type 2 diabetes mellitus without complications: Secondary | ICD-10-CM | POA: Diagnosis not present

## 2018-12-19 DIAGNOSIS — Z125 Encounter for screening for malignant neoplasm of prostate: Secondary | ICD-10-CM

## 2018-12-19 LAB — COMPREHENSIVE METABOLIC PANEL
ALT: 13 U/L (ref 0–53)
AST: 18 U/L (ref 0–37)
Albumin: 4.4 g/dL (ref 3.5–5.2)
Alkaline Phosphatase: 50 U/L (ref 39–117)
BUN: 19 mg/dL (ref 6–23)
CO2: 28 mEq/L (ref 19–32)
Calcium: 9.4 mg/dL (ref 8.4–10.5)
Chloride: 102 mEq/L (ref 96–112)
Creatinine, Ser: 1.22 mg/dL (ref 0.40–1.50)
GFR: 57.52 mL/min — ABNORMAL LOW (ref >60.00)
Glucose, Bld: 120 mg/dL — ABNORMAL HIGH (ref 70–99)
Potassium: 4.8 mEq/L (ref 3.5–5.1)
Sodium: 138 mEq/L (ref 135–145)
Total Bilirubin: 0.7 mg/dL (ref 0.2–1.2)
Total Protein: 6.7 g/dL (ref 6.0–8.3)

## 2018-12-19 LAB — LIPID PANEL
Cholesterol: 181 mg/dL (ref 0–200)
HDL: 46.3 mg/dL (ref >39.00)
NonHDL: 134.88
Total CHOL/HDL Ratio: 4
Triglycerides: 224 mg/dL — ABNORMAL HIGH (ref 0.0–149.0)
VLDL: 44.8 mg/dL — ABNORMAL HIGH (ref 0.0–40.0)

## 2018-12-19 LAB — LDL CHOLESTEROL, DIRECT: Direct LDL: 99 mg/dL

## 2018-12-19 LAB — MICROALBUMIN / CREATININE URINE RATIO
Creatinine,U: 104.7 mg/dL
Microalb Creat Ratio: 0.7 mg/g (ref 0.0–30.0)
Microalb, Ur: <0.7 mg/dL (ref 0.0–1.9)

## 2018-12-19 LAB — HEMOGLOBIN A1C: Hgb A1c MFr Bld: 6.8 % — ABNORMAL HIGH (ref 4.6–6.5)

## 2018-12-19 LAB — PSA, MEDICARE: PSA: 0.34 ng/ml (ref 0.10–4.00)

## 2018-12-19 NOTE — Telephone Encounter (Signed)
Sent. Thanks.   

## 2018-12-23 ENCOUNTER — Ambulatory Visit (INDEPENDENT_AMBULATORY_CARE_PROVIDER_SITE_OTHER): Payer: Medicare Other | Admitting: Family Medicine

## 2018-12-23 ENCOUNTER — Encounter: Payer: Self-pay | Admitting: Family Medicine

## 2018-12-23 ENCOUNTER — Other Ambulatory Visit: Payer: Self-pay

## 2018-12-23 VITALS — BP 138/70 | HR 72 | Temp 97.2°F | Ht 65.5 in | Wt 167.6 lb

## 2018-12-23 DIAGNOSIS — N401 Enlarged prostate with lower urinary tract symptoms: Secondary | ICD-10-CM

## 2018-12-23 DIAGNOSIS — E119 Type 2 diabetes mellitus without complications: Secondary | ICD-10-CM | POA: Diagnosis not present

## 2018-12-23 DIAGNOSIS — E785 Hyperlipidemia, unspecified: Secondary | ICD-10-CM | POA: Diagnosis not present

## 2018-12-23 DIAGNOSIS — Z0000 Encounter for general adult medical examination without abnormal findings: Secondary | ICD-10-CM

## 2018-12-23 DIAGNOSIS — Z7189 Other specified counseling: Secondary | ICD-10-CM

## 2018-12-23 MED ORDER — ATORVASTATIN CALCIUM 10 MG PO TABS: 10.0000 mg | ORAL_TABLET | Freq: Every day | ORAL | 3 refills | Status: DC

## 2018-12-23 NOTE — Patient Instructions (Signed)
Don't change your meds for now.  Keep working on diet and exercise.  Recheck in about 6 months.  A1c at the visit.  You don't have to fast.  Take care.  Glad to see you.

## 2018-12-23 NOTE — Progress Notes (Signed)
Elevated Cholesterol: Using medications without problems: yes Muscle aches: no Diet compliance:yes Exercise:yes Labs d/w pt.   BPH.  Stream is fair.  No ADE on med.  PSA wnl, with correction, d/w pt   DM2.   A1c still <7.  Labs d/w pt.  Not checking sugar at home.  No sx of hypoglycemia.  He has eye exam pending.  D/w pt.    Flu shot d/w pt.  Declined.  Advance directive- wife designated if patient were incapacitated.  Colonoscopy 2012.    PMH and SH reviewed  Meds, vitals, and allergies reviewed.   ROS: Per HPI unless specifically indicated in ROS section   GEN: nad, alert and oriented HEENT: mucous membranes moist NECK: supple w/o LA CV: rrr. PULM: ctab, no inc wob ABD: soft, +bs EXT: no edema SKIN: well perfused.    Diabetic foot exam: Normal inspection No skin breakdown Small B 1st toe calluses  Normal DP pulses Normal sensation to light touch and monofilament Nails slightly thickened.

## 2018-12-25 NOTE — Assessment & Plan Note (Signed)
Flu shot d/w pt.  Declined.  Advance directive- wife designated if patient were incapacitated.  Colonoscopy 2012.

## 2018-12-25 NOTE — Assessment & Plan Note (Signed)
Advance directive- wife designated if patient were incapacitated.  

## 2018-12-25 NOTE — Assessment & Plan Note (Signed)
Labs discussed with patient.  No change in medicine.  Continue work on diet and exercise.  He agrees.

## 2018-12-25 NOTE — Assessment & Plan Note (Signed)
Stream is fair.  No ADE on med.  PSA wnl, with correction, d/w pt. continue as is.  He agrees.

## 2018-12-25 NOTE — Assessment & Plan Note (Addendum)
A1c still <7.  Labs d/w pt.  Not checking sugar at home.  No sx of hypoglycemia.  He has eye exam pending.  Diet discussed with patient.  Recheck A1c in about 6 months.  See after visit summary.  He agrees. >25 minutes spent in face to face time with patient, >50% spent in counselling or coordination of care.

## 2019-06-11 ENCOUNTER — Telehealth: Payer: Self-pay

## 2019-06-11 NOTE — Telephone Encounter (Signed)
Called and left voicemail for patient to call back to schedule 6 month follow up.

## 2019-06-11 NOTE — Telephone Encounter (Signed)
Please schedule patient for 6 months follow up on DM, when able to. Thank you

## 2019-06-24 ENCOUNTER — Telehealth: Payer: Self-pay | Admitting: Family Medicine

## 2019-06-24 NOTE — Telephone Encounter (Signed)
Called patient and left voicemail for patient to call the office to schedule a 6 month f/u.

## 2019-09-04 NOTE — Telephone Encounter (Signed)
Called patient and left voicemail for him to call office to still get scheduled for follow up.

## 2019-12-13 ENCOUNTER — Telehealth: Payer: Self-pay | Admitting: Family Medicine

## 2019-12-14 NOTE — Telephone Encounter (Signed)
Please schedule MWV with nurse and CPE with Dr. Damita Dunnings for Nov 2021.

## 2019-12-14 NOTE — Telephone Encounter (Signed)
Last office visit 12/23/2018 for CPE.  Last refilled 12/19/2019 for #90 with 3 refills.  No future appointments.

## 2019-12-14 NOTE — Telephone Encounter (Signed)
Agreed about scheduling.  Prescription sent.

## 2019-12-29 NOTE — Telephone Encounter (Signed)
Called and left vm for the patient to call us. Did not leave detailed message no DPR . Needs to schedule MWV,labs, and CPE EM

## 2020-01-09 ENCOUNTER — Telehealth: Payer: Self-pay | Admitting: Family Medicine

## 2020-01-09 NOTE — Telephone Encounter (Signed)
On call note, talked with his son.  Patient recently tested positive for covid.  He is also caring for his wife who recently had surgery.  EMS was at the house today.  Patient had passed out 2 days ago.  Per report, was not transported to ER.  Pt is at home with wife and son currently.    Advised son that with recent syncope and known covid, would advise UC eval now.  Son will advice patient and can facilitate that.    I don't if this patient or his wife is a candidate for home health, esp with known covid positive status.  I will ask referral coordinator to see if any help is available for next week.

## 2020-01-10 ENCOUNTER — Emergency Department: Payer: Medicare Other

## 2020-01-10 ENCOUNTER — Inpatient Hospital Stay
Admission: EM | Admit: 2020-01-10 | Discharge: 2020-01-20 | DRG: 177 | Disposition: A | Discharge status: Home Health | Payer: Medicare Other | Attending: Internal Medicine | Admitting: Internal Medicine

## 2020-01-10 ENCOUNTER — Other Ambulatory Visit: Payer: Self-pay

## 2020-01-10 DIAGNOSIS — E119 Type 2 diabetes mellitus without complications: Secondary | ICD-10-CM | POA: Diagnosis present

## 2020-01-10 DIAGNOSIS — J1282 Pneumonia due to coronavirus disease 2019: Secondary | ICD-10-CM | POA: Diagnosis present

## 2020-01-10 DIAGNOSIS — Z79899 Other long term (current) drug therapy: Secondary | ICD-10-CM

## 2020-01-10 DIAGNOSIS — M1990 Unspecified osteoarthritis, unspecified site: Secondary | ICD-10-CM | POA: Diagnosis present

## 2020-01-10 DIAGNOSIS — Z8249 Family history of ischemic heart disease and other diseases of the circulatory system: Secondary | ICD-10-CM

## 2020-01-10 DIAGNOSIS — S0011XA Contusion of right eyelid and periocular area, initial encounter: Secondary | ICD-10-CM | POA: Diagnosis present

## 2020-01-10 DIAGNOSIS — J159 Unspecified bacterial pneumonia: Secondary | ICD-10-CM | POA: Diagnosis present

## 2020-01-10 DIAGNOSIS — G4700 Insomnia, unspecified: Secondary | ICD-10-CM | POA: Diagnosis present

## 2020-01-10 DIAGNOSIS — Z885 Allergy status to narcotic agent status: Secondary | ICD-10-CM

## 2020-01-10 DIAGNOSIS — E782 Mixed hyperlipidemia: Secondary | ICD-10-CM

## 2020-01-10 DIAGNOSIS — Y92019 Unspecified place in single-family (private) house as the place of occurrence of the external cause: Secondary | ICD-10-CM

## 2020-01-10 DIAGNOSIS — Z791 Long term (current) use of non-steroidal anti-inflammatories (NSAID): Secondary | ICD-10-CM

## 2020-01-10 DIAGNOSIS — J9601 Acute respiratory failure with hypoxia: Secondary | ICD-10-CM | POA: Diagnosis not present

## 2020-01-10 DIAGNOSIS — W1830XA Fall on same level, unspecified, initial encounter: Secondary | ICD-10-CM | POA: Diagnosis present

## 2020-01-10 DIAGNOSIS — R55 Syncope and collapse: Secondary | ICD-10-CM | POA: Diagnosis not present

## 2020-01-10 DIAGNOSIS — Z87891 Personal history of nicotine dependence: Secondary | ICD-10-CM

## 2020-01-10 DIAGNOSIS — Z880 Allergy status to penicillin: Secondary | ICD-10-CM

## 2020-01-10 DIAGNOSIS — Z888 Allergy status to other drugs, medicaments and biological substances status: Secondary | ICD-10-CM

## 2020-01-10 DIAGNOSIS — E785 Hyperlipidemia, unspecified: Secondary | ICD-10-CM | POA: Diagnosis present

## 2020-01-10 DIAGNOSIS — S0511XA Contusion of eyeball and orbital tissues, right eye, initial encounter: Secondary | ICD-10-CM | POA: Diagnosis present

## 2020-01-10 DIAGNOSIS — N400 Enlarged prostate without lower urinary tract symptoms: Secondary | ICD-10-CM | POA: Diagnosis present

## 2020-01-10 DIAGNOSIS — R748 Abnormal levels of other serum enzymes: Secondary | ICD-10-CM

## 2020-01-10 DIAGNOSIS — E1165 Type 2 diabetes mellitus with hyperglycemia: Secondary | ICD-10-CM | POA: Diagnosis not present

## 2020-01-10 DIAGNOSIS — J189 Pneumonia, unspecified organism: Secondary | ICD-10-CM

## 2020-01-10 DIAGNOSIS — Z833 Family history of diabetes mellitus: Secondary | ICD-10-CM

## 2020-01-10 DIAGNOSIS — T380X5A Adverse effect of glucocorticoids and synthetic analogues, initial encounter: Secondary | ICD-10-CM | POA: Diagnosis not present

## 2020-01-10 DIAGNOSIS — R739 Hyperglycemia, unspecified: Secondary | ICD-10-CM | POA: Diagnosis present

## 2020-01-10 DIAGNOSIS — T50905A Adverse effect of unspecified drugs, medicaments and biological substances, initial encounter: Secondary | ICD-10-CM | POA: Diagnosis present

## 2020-01-10 DIAGNOSIS — U071 COVID-19: Principal | ICD-10-CM | POA: Diagnosis present

## 2020-01-10 LAB — CBC WITH DIFFERENTIAL/PLATELET
Abs Immature Granulocytes: 0.14 10*3/uL — ABNORMAL HIGH (ref 0.00–0.07)
Basophils Absolute: 0 10*3/uL (ref 0.0–0.1)
Basophils Relative: 1 %
Eosinophils Absolute: 0 10*3/uL (ref 0.0–0.5)
Eosinophils Relative: 1 %
HCT: 53 % — ABNORMAL HIGH (ref 39.0–52.0)
Hemoglobin: 18.1 g/dL — ABNORMAL HIGH (ref 13.0–17.0)
Immature Granulocytes: 3 %
Lymphocytes Relative: 11 %
Lymphs Abs: 0.5 10*3/uL — ABNORMAL LOW (ref 0.7–4.0)
MCH: 30.2 pg (ref 26.0–34.0)
MCHC: 34.2 g/dL (ref 30.0–36.0)
MCV: 88.3 fL (ref 80.0–100.0)
Monocytes Absolute: 0.2 10*3/uL (ref 0.1–1.0)
Monocytes Relative: 4 %
Neutro Abs: 3.8 10*3/uL (ref 1.7–7.7)
Neutrophils Relative %: 80 %
Platelets: 157 10*3/uL (ref 150–400)
RBC: 6 MIL/uL — ABNORMAL HIGH (ref 4.22–5.81)
RDW: 13.3 % (ref 11.5–15.5)
WBC: 4.8 10*3/uL (ref 4.0–10.5)
nRBC: 0 % (ref 0.0–0.2)

## 2020-01-10 LAB — TROPONIN I (HIGH SENSITIVITY)
Troponin I (High Sensitivity): 6 ng/L (ref <18)
Troponin I (High Sensitivity): 7 ng/L (ref <18)

## 2020-01-10 LAB — FIBRIN DERIVATIVES D-DIMER (ARMC ONLY): Fibrin derivatives D-dimer (ARMC): >7500 ng/mL (FEU) — ABNORMAL HIGH (ref 0.00–499.00)

## 2020-01-10 LAB — COMPREHENSIVE METABOLIC PANEL
ALT: 71 U/L — ABNORMAL HIGH (ref 0–44)
AST: 74 U/L — ABNORMAL HIGH (ref 15–41)
Albumin: 2.9 g/dL — ABNORMAL LOW (ref 3.5–5.0)
Alkaline Phosphatase: 94 U/L (ref 38–126)
Anion gap: 15 (ref 5–15)
BUN: 30 mg/dL — ABNORMAL HIGH (ref 8–23)
CO2: 23 mmol/L (ref 22–32)
Calcium: 8.4 mg/dL — ABNORMAL LOW (ref 8.9–10.3)
Chloride: 98 mmol/L (ref 98–111)
Creatinine, Ser: 1.2 mg/dL (ref 0.61–1.24)
GFR, Estimated: >60 mL/min (ref >60)
Glucose, Bld: 143 mg/dL — ABNORMAL HIGH (ref 70–99)
Potassium: 4.1 mmol/L (ref 3.5–5.1)
Sodium: 136 mmol/L (ref 135–145)
Total Bilirubin: 2.1 mg/dL — ABNORMAL HIGH (ref 0.3–1.2)
Total Protein: 7.8 g/dL (ref 6.5–8.1)

## 2020-01-10 LAB — BRAIN NATRIURETIC PEPTIDE: B Natriuretic Peptide: 40 pg/mL (ref 0.0–100.0)

## 2020-01-10 LAB — BLOOD GAS, VENOUS
Acid-Base Excess: 0.6 mmol/L (ref 0.0–2.0)
Bicarbonate: 26 mmol/L (ref 20.0–28.0)
O2 Saturation: 26.1 %
Patient temperature: 37
pCO2, Ven: 43 mmHg — ABNORMAL LOW (ref 44.0–60.0)
pH, Ven: 7.39 (ref 7.250–7.430)
pO2, Ven: 18 mmHg — CL (ref 32.0–45.0)

## 2020-01-10 LAB — MAGNESIUM: Magnesium: 2.7 mg/dL — ABNORMAL HIGH (ref 1.7–2.4)

## 2020-01-10 LAB — PROCALCITONIN: Procalcitonin: 0.4 ng/mL

## 2020-01-10 MED ORDER — TOCILIZUMAB 400 MG/20ML IV SOLN: 8.0000 mg/kg | Freq: Once | INTRAVENOUS | Status: DC

## 2020-01-10 MED ORDER — ATORVASTATIN CALCIUM 20 MG PO TABS
10.0000 mg | ORAL_TABLET | Freq: Every day | ORAL | Status: DC
  Administered 2020-01-11 – 2020-01-19 (×9): 10 mg via ORAL
  Filled 2020-01-10 (×9): qty 1

## 2020-01-10 MED ORDER — SODIUM CHLORIDE 0.9 % IV SOLN
200.0000 mg | Freq: Once | INTRAVENOUS | Status: AC
  Administered 2020-01-10: 200 mg via INTRAVENOUS
  Filled 2020-01-10: qty 200

## 2020-01-10 MED ORDER — SODIUM CHLORIDE 0.9 % IV SOLN
100.0000 mg | Freq: Every day | INTRAVENOUS | Status: AC
  Administered 2020-01-11 – 2020-01-14 (×4): 100 mg via INTRAVENOUS
  Filled 2020-01-10: qty 20
  Filled 2020-01-10: qty 100
  Filled 2020-01-10 (×2): qty 20

## 2020-01-10 MED ORDER — FINASTERIDE 5 MG PO TABS
5.0000 mg | ORAL_TABLET | Freq: Every day | ORAL | Status: DC
  Administered 2020-01-11 – 2020-01-20 (×10): 5 mg via ORAL
  Filled 2020-01-10 (×10): qty 1

## 2020-01-10 MED ORDER — ENOXAPARIN SODIUM 40 MG/0.4ML ~~LOC~~ SOLN
40.0000 mg | SUBCUTANEOUS | Status: DC
  Administered 2020-01-11: 40 mg via SUBCUTANEOUS
  Filled 2020-01-10: qty 0.4

## 2020-01-10 MED ORDER — TAMSULOSIN HCL 0.4 MG PO CAPS
0.4000 mg | ORAL_CAPSULE | Freq: Every day | ORAL | Status: DC
  Administered 2020-01-11 – 2020-01-20 (×10): 0.4 mg via ORAL
  Filled 2020-01-10 (×10): qty 1

## 2020-01-10 MED ORDER — ACETAMINOPHEN 500 MG PO TABS
1000.0000 mg | ORAL_TABLET | Freq: Once | ORAL | Status: AC
  Administered 2020-01-10: 1000 mg via ORAL
  Filled 2020-01-10: qty 2

## 2020-01-10 MED ORDER — IOHEXOL 350 MG/ML SOLN
75.0000 mL | Freq: Once | INTRAVENOUS | Status: AC | PRN
  Administered 2020-01-10: 75 mL via INTRAVENOUS

## 2020-01-10 MED ORDER — DEXAMETHASONE SODIUM PHOSPHATE 10 MG/ML IJ SOLN
6.0000 mg | INTRAMUSCULAR | Status: DC
  Administered 2020-01-11: 6 mg via INTRAVENOUS
  Filled 2020-01-10: qty 1

## 2020-01-10 MED ORDER — SODIUM CHLORIDE 0.9 % IV SOLN
100.0000 mg | Freq: Two times a day (BID) | INTRAVENOUS | Status: DC
  Administered 2020-01-10 – 2020-01-12 (×4): 100 mg via INTRAVENOUS
  Filled 2020-01-10 (×5): qty 100

## 2020-01-10 MED ORDER — LEVOFLOXACIN IN D5W 750 MG/150ML IV SOLN
750.0000 mg | INTRAVENOUS | Status: DC
  Administered 2020-01-10: 750 mg via INTRAVENOUS
  Filled 2020-01-10: qty 150

## 2020-01-10 MED ORDER — SODIUM CHLORIDE 0.9 % IV SOLN
500.0000 mg | INTRAVENOUS | Status: DC
  Administered 2020-01-11 – 2020-01-14 (×4): 500 mg via INTRAVENOUS
  Filled 2020-01-10 (×5): qty 500

## 2020-01-10 MED ORDER — DEXAMETHASONE SODIUM PHOSPHATE 10 MG/ML IJ SOLN
6.0000 mg | Freq: Once | INTRAMUSCULAR | Status: AC
  Administered 2020-01-10: 6 mg via INTRAVENOUS
  Filled 2020-01-10: qty 1

## 2020-01-10 NOTE — Progress Notes (Signed)
Remdesivir - Pharmacy Brief Note   O:  CXR: pending SpO2: 88% on 15L/min NRB   A/P:  Remdesivir 200 mg IVPB once followed by 100 mg IVPB daily x 4 days.   Sherilyn Banker, PharmD Pharmacy Resident  01/10/2020 1:28 PM

## 2020-01-10 NOTE — H&P (Addendum)
History and Physical   KHYAN OATS IRW:[id number] DOB: [birth year] DOA: 01/10/2020  PCP: Tonia Ghent, MD  Outpatient Specialists: Currently no outpatient specialists Patient coming from: Home  I have personally briefly reviewed patient's old medical records in [place].  Chief Concern: Worsening shortness of breath  HPI: Steven Hall is a 79 y.o. male with medical history significant for BPH, hyperlipidemia on atorvastatin 10 mg daily presented to the emergency department at the urging of his son for worsening shortness of breath.  He was diagnosed with COVID-19 on 12/31/2019.  He states that he has not gotten his Covid vaccine because 40 years ago when he had 1 dose of flu vaccine he developed a flu.  On day of presentation, patient was sitting on the couch when he bent over and lost consciousness.  He hit his head on the coffee table.  He endorsed loss of consciousness.  He denies urinary and bowel incontinence.  He endorses insomnia at baseline.   Review of system was negative for fever, chest pain, abdominal, nausea, vomiting, dysphagia, chills, headache, dysuria, hematuria, diarrhea, lost of appetite, lost of smell or taste, unintentional weight changes.  Social history: retired, used to work for [employer], lives with wife. Denies tobacco use (smoked 1/2 ppd, quit smoking 2020), drinks etoh once per month, has one beer, denies recreational drug use.   ED Course: Discussed with ED provider.  Review of Systems: As per HPI otherwise 10 point review of systems negative.  Assessment/Plan  Principal Problem:   Acute hypoxemic respiratory failure due to COVID-19 [facility]) Active Problems:   HLD (hyperlipidemia)   Pneumonia due to COVID-19 virus   BPH (benign prostatic hyperplasia)   Traumatic periorbital ecchymosis of right eye   Acute hypoxic respiratory failure due to COVID-19 infection -Requiring high flow nasal cannula to maintain spO2 > 92% -Status post  remdesivir and Decadron per ED provider -Remdesivir has been started and pharmacy has been consulted -Decadron 6 mg IV daily continued -Azithromycin 500 mg daily IV and doxycycline 100 BID IV started for prophylaxis of bacterial pneumonia -Admit to progressive cardiac -Evaluation the patient throughout the day, patient continued to require respiratory muscles on high flow nasal cannula, -Extensive discussion regarding trial of one dose of tocilizumab for anti-inflammatory benefits in conjunction with IV dexamethasone.  I also explained the risk of tocilizumab including but not limited to liver injury/hepatitis/liver failure, and the current uncertainty of tocilizumab -Patient is awake alert and oriented to self, age, current location, year and agrees to receiving Actemra -Counseling given to have COVID-19 vaccine -Signout given for coverage provider, if patient continues to have increased respiratory rate, to alert critical care specialist  Syncopal episode-suspect secondary to acute hypoxic respiratory failure due to COVID-19 infection, if indicated primary care provider can discharge patient with home event cardiac monitor -Outpatient follow-up  Superimposed bacterial infection-azithromycin and doxycycline as above  Hyperlipidemia-resumed statin  Right eye ecchymosis present on admission - CT maxillofacial wo contrast imaging read as negative for fracture  -Nursing place for warm or cold compress to right orbital area if patient requested  Extensive discussion regarding emergency use of remdesivir and side effects including but not limited to liver injury and/or failure  DVT prophylaxis: Enoxaparin Code Status: Code Diet: Regular Family Communication: None at this time Disposition Plan: Pending clinical course Consults called: None at this time Admission status: Observation to the progressive units with telemetry  Past Medical History:  Diagnosis Date  . Allergy   .  Arthritis     B hands  . Back pain    lumbar disc disease  . BPH (benign prostatic hyperplasia)   . ED (erectile dysfunction)   . Hyperlipidemia   . Inguinal hernia   . Vertigo    Past Surgical History:  Procedure Laterality Date  . TONSILLECTOMY  1968   Social History:  reports that he has quit smoking. He quit smokeless tobacco use about 18 years ago. He reports current alcohol use. He reports that he does not use drugs.  Allergies  Allergen Reactions  . Penicillins     REACTION: syncope  . Other     Unrecalled sleep aid- caused insomnia  . Tramadol Other (See Comments)    Panic/cold sweats after stopping medicine (after about 5 days of use for foot pain)   Family History  Problem Relation Age of Onset  . Coronary artery disease Brother 68  . Diabetes Mother   . Hypertension Mother   . Heart disease Father   . Colon cancer Neg Hx   . Prostate cancer Neg Hx    Family history: Family history reviewed and not pertinent.  Family history of heart disease in father and brother.  Prior to Admission medications   Medication Sig Start Date End Date Taking? Authorizing Provider  atorvastatin (LIPITOR) 10 MG tablet Take 1 tablet (10 mg total) by mouth at bedtime. 12/23/18  Yes Tonia Ghent, MD  meloxicam (MOBIC) 15 MG tablet TAKE 1 TABLET DAILY AFTER SUPPER 12/14/19  Yes Tonia Ghent, MD  tamsulosin (FLOMAX) 0.4 MG CAPS capsule TAKE 1 CAPSULE DAILY 12/14/19  Yes Tonia Ghent, MD  finasteride (PROSCAR) 5 MG tablet TAKE 1 TABLET DAILY 12/14/19   Tonia Ghent, MD  loratadine (CLARITIN) 10 MG tablet Take 10 mg by mouth daily as needed for allergies.     [provider]   Physical Exam: Vitals:   01/10/20 1900 01/10/20 2000 01/10/20 2100 01/10/20 2200  BP: (!) 139/91 125/81 (!) 132/98 125/84  Pulse: 92 81 79 70  Resp: (!) 28 (!) 28 (!) 22 (!) 23  Temp:      TempSrc:      SpO2: 97% 94% 91% 94%   Constitutional: appears uncomfortable, NAD Eyes: PERRL, lids and  conjunctivae normal, right periorbital ecchymosis present ENMT: Mucous membranes are moist. Posterior pharynx clear of any exudate or lesions. Age-appropriate dentition. Hearing appropriate Neck: normal, supple, no masses, no thyromegaly Respiratory: Diffusely decreased breath sounds bilaterally.  Increased respiratory effort. No accessory muscle use.  Cardiovascular: Regular rate and rhythm, no murmurs / rubs / gallops. No extremity edema. 2+ pedal pulses. No carotid bruits.  Abdomen: no tenderness, no masses palpated, no hepatosplenomegaly. Bowel sounds positive.  Musculoskeletal: no clubbing / cyanosis. No joint deformity upper and lower extremities. Good ROM, no contractures, no atrophy. Normal muscle tone.  Skin: no rashes, lesions, ulcers. No induration Neurologic: CN 2-12 grossly intact. Sensation intact. Strength 5/5 in all 4.  Psychiatric: Normal judgment and insight. Alert and oriented x 3. Normal mood.   EKG: Independently reviewed, showing sinus rhythm with right bundle branch block.  Right bundle branch block of unknown chronicity  Chest x-ray on Admission: Personally reviewed and I agree with radiologist reading as below.  CT Head Wo Contrast  Result Date: 01/10/2020 CLINICAL DATA:  COVID-19 positive, respiratory distress, fell EXAM: CT HEAD WITHOUT CONTRAST CT MAXILLOFACIAL WITHOUT CONTRAST CT CERVICAL SPINE WITHOUT CONTRAST TECHNIQUE: Multidetector CT imaging of the head, cervical spine, and maxillofacial  structures were performed using the standard protocol without intravenous contrast. Multiplanar CT image reconstructions of the cervical spine and maxillofacial structures were also generated. COMPARISON:  None. FINDINGS: CT HEAD FINDINGS Brain: No acute infarct or hemorrhage. Lateral ventricles and midline structures appear unremarkable. No acute extra-axial fluid collections. No mass effect. Vascular: No hyperdense vessel or unexpected calcification. Skull: Normal. Negative for  fracture or focal lesion. Other: None. CT MAXILLOFACIAL FINDINGS Osseous: No fracture or mandibular dislocation. No destructive process. Orbits: Negative. No traumatic or inflammatory finding. Sinuses: Minimal mucosal thickening within the anterior ethmoid air cells. Soft tissues: Negative. CT CERVICAL SPINE FINDINGS Alignment: Alignment is grossly anatomic. Skull base and vertebrae: No acute fracture. No primary bone lesion or focal pathologic process. Soft tissues and spinal canal: No prevertebral fluid or swelling. No visible canal hematoma. Disc levels: Multilevel facet hypertrophy greatest from C2 through C5, right greater than left. This results and right greater than left neural foraminal narrowing at C2-3, C3-4, and C4-5. Disc spaces are relatively well preserved. Upper chest: Airways patent. Widespread biapical ground-glass airspace disease consistent with given history of COVID-19. Other: Reconstructed images demonstrate no additional findings. IMPRESSION: 1. No acute intracranial process. 2. No acute facial bone fracture.  Minimal ethmoid sinus disease. 3. No acute cervical spine fracture. Multilevel cervical facet hypertrophy as above. 4. Bilateral upper lobe airspace disease consistent with COVID 19 pneumonia. Electronically Signed   By: Randa Ngo M.D.   On: 01/10/2020 15:55   CT Angio Chest PE W and/or Wo Contrast  Result Date: 01/10/2020 CLINICAL DATA:  Respiratory distress, COVID positive EXAM: CT ANGIOGRAPHY CHEST WITH CONTRAST TECHNIQUE: Multidetector CT imaging of the chest was performed using the standard protocol during bolus administration of intravenous contrast. Multiplanar CT image reconstructions and MIPs were obtained to evaluate the vascular anatomy. CONTRAST:  12mL OMNIPAQUE IOHEXOL 350 MG/ML SOLN COMPARISON:  None. FINDINGS: Cardiovascular: There is a optimal opacification of the pulmonary arteries. There is no central,segmental, or subsegmental filling defects within the  pulmonary arteries. There is mild cardiomegaly present. No pericardial effusion or thickening. No evidence right heart strain. There is normal three-vessel brachiocephalic anatomy without proximal stenosis. Scattered mild aortic atherosclerosis is seen. Mediastinum/Nodes: No hilar, mediastinal, or axillary adenopathy. Thyroid gland, trachea, and esophagus demonstrate no significant findings. Lungs/Pleura: Multifocal patchy ground-glass airspace opacities are seen throughout both lungs, predominantly within the periphery. No pleural effusion is seen. Upper Abdomen: No acute abnormalities present in the visualized portions of the upper abdomen. Musculoskeletal: No chest wall abnormality. No acute or significant osseous findings. Review of the MIP images confirms the above findings. IMPRESSION: No central, segmental, or subsegmental pulmonary embolism. Extensive multifocal patchy ground-glass opacities are seen throughout both lungs, predominantly within the periphery, likely consistent with multifocal atypical viral pneumonia. Aortic Atherosclerosis (ICD10-I70.0). Electronically Signed   By: Prudencio Pair M.D.   On: 01/10/2020 15:58   CT Cervical Spine Wo Contrast  Result Date: 01/10/2020 CLINICAL DATA:  COVID-19 positive, respiratory distress, fell EXAM: CT HEAD WITHOUT CONTRAST CT MAXILLOFACIAL WITHOUT CONTRAST CT CERVICAL SPINE WITHOUT CONTRAST TECHNIQUE: Multidetector CT imaging of the head, cervical spine, and maxillofacial structures were performed using the standard protocol without intravenous contrast. Multiplanar CT image reconstructions of the cervical spine and maxillofacial structures were also generated. COMPARISON:  None. FINDINGS: CT HEAD FINDINGS Brain: No acute infarct or hemorrhage. Lateral ventricles and midline structures appear unremarkable. No acute extra-axial fluid collections. No mass effect. Vascular: No hyperdense vessel or unexpected calcification. Skull: Normal. Negative for fracture  or focal lesion. Other: None. CT MAXILLOFACIAL FINDINGS Osseous: No fracture or mandibular dislocation. No destructive process. Orbits: Negative. No traumatic or inflammatory finding. Sinuses: Minimal mucosal thickening within the anterior ethmoid air cells. Soft tissues: Negative. CT CERVICAL SPINE FINDINGS Alignment: Alignment is grossly anatomic. Skull base and vertebrae: No acute fracture. No primary bone lesion or focal pathologic process. Soft tissues and spinal canal: No prevertebral fluid or swelling. No visible canal hematoma. Disc levels: Multilevel facet hypertrophy greatest from C2 through C5, right greater than left. This results and right greater than left neural foraminal narrowing at C2-3, C3-4, and C4-5. Disc spaces are relatively well preserved. Upper chest: Airways patent. Widespread biapical ground-glass airspace disease consistent with given history of COVID-19. Other: Reconstructed images demonstrate no additional findings. IMPRESSION: 1. No acute intracranial process. 2. No acute facial bone fracture.  Minimal ethmoid sinus disease. 3. No acute cervical spine fracture. Multilevel cervical facet hypertrophy as above. 4. Bilateral upper lobe airspace disease consistent with COVID 19 pneumonia. Electronically Signed   By: Randa Ngo M.D.   On: 01/10/2020 15:55   CT Maxillofacial Wo Contrast  Result Date: 01/10/2020 CLINICAL DATA:  COVID-19 positive, respiratory distress, fell EXAM: CT HEAD WITHOUT CONTRAST CT MAXILLOFACIAL WITHOUT CONTRAST CT CERVICAL SPINE WITHOUT CONTRAST TECHNIQUE: Multidetector CT imaging of the head, cervical spine, and maxillofacial structures were performed using the standard protocol without intravenous contrast. Multiplanar CT image reconstructions of the cervical spine and maxillofacial structures were also generated. COMPARISON:  None. FINDINGS: CT HEAD FINDINGS Brain: No acute infarct or hemorrhage. Lateral ventricles and midline structures appear  unremarkable. No acute extra-axial fluid collections. No mass effect. Vascular: No hyperdense vessel or unexpected calcification. Skull: Normal. Negative for fracture or focal lesion. Other: None. CT MAXILLOFACIAL FINDINGS Osseous: No fracture or mandibular dislocation. No destructive process. Orbits: Negative. No traumatic or inflammatory finding. Sinuses: Minimal mucosal thickening within the anterior ethmoid air cells. Soft tissues: Negative. CT CERVICAL SPINE FINDINGS Alignment: Alignment is grossly anatomic. Skull base and vertebrae: No acute fracture. No primary bone lesion or focal pathologic process. Soft tissues and spinal canal: No prevertebral fluid or swelling. No visible canal hematoma. Disc levels: Multilevel facet hypertrophy greatest from C2 through C5, right greater than left. This results and right greater than left neural foraminal narrowing at C2-3, C3-4, and C4-5. Disc spaces are relatively well preserved. Upper chest: Airways patent. Widespread biapical ground-glass airspace disease consistent with given history of COVID-19. Other: Reconstructed images demonstrate no additional findings. IMPRESSION: 1. No acute intracranial process. 2. No acute facial bone fracture.  Minimal ethmoid sinus disease. 3. No acute cervical spine fracture. Multilevel cervical facet hypertrophy as above. 4. Bilateral upper lobe airspace disease consistent with COVID 19 pneumonia. Electronically Signed   By: Randa Ngo M.D.   On: 01/10/2020 15:55   Labs on Admission: I have personally reviewed following labs  CBC: Recent Labs  Lab 01/10/20 1332  WBC 4.8  NEUTROABS 3.8  HGB 18.1*  HCT 53.0*  MCV 88.3  PLT 263   Basic Metabolic Panel: Recent Labs  Lab 01/10/20 1332  NA 136  K 4.1  CL 98  CO2 23  GLUCOSE 143*  BUN 30*  CREATININE 1.20  CALCIUM 8.4*  MG 2.7*   Liver Function Tests: Recent Labs  Lab 01/10/20 1332  AST 74*  ALT 71*  ALKPHOS 94  BILITOT 2.1*  PROT 7.8  ALBUMIN 2.9*    Critical care  Performed by: Rupert Stacks, DO Authorized by: Rupert Stacks, DO  Critical care time: 50 minutes  Critical care was necessary to treat and prevent imminent or life-threatening deterioration of the following conditions: Acute hypoxic respiratory failure secondary to COVID-19 infection  Critical care was time spent personally by me on the following activities: Discussions with ED provider, evaluation of patient's response to treatment, examination of patient and re-examination of patient, ordering and performing treatments and interventions, ordering and reviewing of laboratory studies, reviewing radiographic studies, pulse oximetry, reevaluation of patient's condition, obtaining history from patient and reviewing of old chart charts.  Kenaz Olafson N Orvilla Truett D.O. [facility]  If 12AM-7AM, please contact overnight-coverage provider If 7AM-7PM, please contact day coverage provider [email or web address]  01/10/2020, 11:15 PM

## 2020-01-10 NOTE — ED Provider Notes (Signed)
[facility] Emergency Department Provider Note  ____________________________________________   First MD Initiated Contact with Patient 01/10/20 1305     (approximate)  I have reviewed the triage vital signs and the nursing notes.   HISTORY  Chief Complaint Respiratory Distress   HPI Steven Hall is a 79 y.o. male with a past medical history of BPH, HDL, DJD, and inguinal hernia as well as recent diagnosis of COVID-19 on 10/28 at [facility] who presents via EMS from home for assessment of generalized weakness.  Patient states he has been feeling progressively more weak with worsening shortness of breath and cough since his diagnosis.  He states he passed out a couple days ago but is not sure exactly when.  He states he struck the right side of his face when he passed out.  He did not seek medical attention at this time.  He states he currently does not have any face pain, chest pain abdominal pain, has not had any vomiting, diarrhea, dysuria, rash, or other recent falls today or yesterday.  Denies EtOH or drug use or tobacco abuse.  No prior synopsis.  No clear alleviating or aggravating factors.         Past Medical History:  Diagnosis Date  . Allergy   . Arthritis    B hands  . Back pain    lumbar disc disease  . BPH (benign prostatic hyperplasia)   . ED (erectile dysfunction)   . Hyperlipidemia   . Inguinal hernia   . Vertigo     Patient Active Problem List   Diagnosis Date Noted  . Diabetes mellitus without complication ([place]) 97/98/9211  . Healthcare maintenance 12/14/2016  . Hyperkalemia 12/14/2016  . Hyperglycemia 03/12/2014  . Advance care planning 03/10/2014  . External hemorrhoid 12/15/2012  . Medicare annual wellness visit, subsequent 05/25/2010  . INGUINAL PAIN, RIGHT 03/15/2010  . BENIGN POSITIONAL VERTIGO 08/18/2008  . HLD (hyperlipidemia) 08/05/2007  . Enlarged prostate with lower urinary tract symptoms (LUTS) 08/05/2007  .  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 ALLERGY, ENVIRONMENTAL 08/05/2007    Past Surgical History:  Procedure Laterality Date  . TONSILLECTOMY  1968    Prior to Admission medications   Medication Sig Start Date End Date Taking? Authorizing Provider  atorvastatin (LIPITOR) 10 MG tablet Take 1 tablet (10 mg total) by mouth at bedtime. 12/23/18   Tonia Ghent, MD  finasteride (PROSCAR) 5 MG tablet TAKE 1 TABLET DAILY 12/14/19   Tonia Ghent, MD  loratadine (CLARITIN) 10 MG tablet Take 10 mg by mouth as needed.      [provider]  meloxicam (MOBIC) 15 MG tablet TAKE 1 TABLET DAILY AFTER SUPPER 12/14/19   Tonia Ghent, MD  tamsulosin [facility]) 0.4 MG CAPS capsule TAKE 1 CAPSULE DAILY 12/14/19   Tonia Ghent, MD    Allergies Penicillins, Other, and Tramadol  Family History  Problem Relation Age of Onset  . Coronary artery disease Brother 53  . Diabetes Mother   . Hypertension Mother   . Heart disease Father   . Colon cancer Neg Hx   . Prostate cancer Neg Hx     Social History Social History   Tobacco Use  . Smoking status: Former Research scientist (life sciences)  . Smokeless tobacco: Former Systems developer    Quit date: 03/05/2001  Vaping Use  . Vaping Use: Never used  Substance Use Topics  . Alcohol use: Yes    Comment: occ  . Drug use: No  Review of Systems  Review of Systems  Constitutional: Positive for chills, fever and malaise/fatigue.  HENT: Negative for sore throat.   Eyes: Negative for pain.  Respiratory: Positive for cough and shortness of breath. Negative for stridor.   Cardiovascular: Negative for chest pain.  Gastrointestinal: Negative for vomiting.  Genitourinary: Negative for dysuria.  Musculoskeletal: Positive for falls ( 1x a couple days ago). Negative for joint pain and neck pain.  Skin: Negative for rash.  Neurological: Positive for dizziness. Negative for seizures, loss of consciousness and headaches.  Psychiatric/Behavioral: Negative for suicidal  ideas.  All other systems reviewed and are negative.     ____________________________________________   PHYSICAL EXAM:  VITAL SIGNS: ED Triage Vitals  Enc Vitals Group     BP      Pulse      Resp      Temp      Temp src      SpO2      Weight      Height      Head Circumference      Peak Flow      Pain Score      Pain Loc      Pain Edu?      Excl. in [place]?    Vitals:   01/10/20 1330 01/10/20 1448  BP: (!) 174/83   Pulse: 88   Resp: (!) 26   Temp:  97.8 F (36.6 C)  SpO2: 93%    Physical Exam Vitals and nursing note reviewed.  Constitutional:      Appearance: He is well-developed.  HENT:     Head: Normocephalic and atraumatic.     Right Ear: External ear normal.     Left Ear: External ear normal.     Nose: Nose normal.     Mouth/Throat:     Mouth: Mucous membranes are moist.  Eyes:     Conjunctiva/sclera: Conjunctivae normal.  Cardiovascular:     Rate and Rhythm: Normal rate and regular rhythm.     Heart sounds: No murmur heard.   Pulmonary:     Effort: Tachypnea and respiratory distress present.     Breath sounds: Examination of the right-upper field reveals decreased breath sounds. Examination of the left-upper field reveals decreased breath sounds and rhonchi. Examination of the right-middle field reveals decreased breath sounds and rhonchi. Examination of the left-middle field reveals decreased breath sounds and rhonchi. Examination of the right-lower field reveals decreased breath sounds and rhonchi. Examination of the left-lower field reveals decreased breath sounds and rhonchi. Decreased breath sounds and rhonchi present.  Abdominal:     Palpations: Abdomen is soft.     Tenderness: There is no abdominal tenderness.  Musculoskeletal:     Cervical back: Neck supple.  Skin:    General: Skin is warm and dry.     Capillary Refill: Capillary refill takes less than 2 seconds.  Neurological:     Mental Status: He is alert and oriented to person, place, and  time.  Psychiatric:        Mood and Affect: Mood normal.     Right-sided periorbital ecchymosis.  PERRLA.  EOMI.  Symmetric strength in extremities.  Sensation is intact light touch all extremities.  No tenderness over the C or T or L-spine. ____________________________________________   LABS (all labs ordered are listed, but only abnormal results are displayed)  Labs Reviewed  CBC WITH DIFFERENTIAL/PLATELET - Abnormal; Notable for the following components:      Result Value  RBC 6.00 (*)    Hemoglobin 18.1 (*)    HCT 53.0 (*)    Lymphs Abs 0.5 (*)    Abs Immature Granulocytes 0.14 (*)    All other components within normal limits  COMPREHENSIVE METABOLIC PANEL - Abnormal; Notable for the following components:   Glucose, Bld 143 (*)    BUN 30 (*)    Calcium 8.4 (*)    Albumin 2.9 (*)    AST 74 (*)    ALT 71 (*)    Total Bilirubin 2.1 (*)    All other components within normal limits  MAGNESIUM - Abnormal; Notable for the following components:   Magnesium 2.7 (*)    All other components within normal limits  BLOOD GAS, VENOUS - Abnormal; Notable for the following components:   pCO2, Ven 43 (*)    pO2, Ven 18.0 (*)    All other components within normal limits  FIBRIN DERIVATIVES D-DIMER ([facility] ONLY) - Abnormal; Notable for the following components:   Fibrin derivatives D-dimer ([facility]) >7,500.00 (*)    All other components within normal limits  CULTURE, BLOOD (ROUTINE X 2)  CULTURE, BLOOD (ROUTINE X 2)  BRAIN NATRIURETIC PEPTIDE  PROCALCITONIN  TROPONIN I (HIGH SENSITIVITY)  TROPONIN I (HIGH SENSITIVITY)   ____________________________________________  EKG  Sinus rhythm with a ventricular rate of 98, right bundle branch block, left posterior fascicle block, nonspecific ST changes in inferior leads and lateral leads without other clear evidence of acute ischemia. ____________________________________________  RADIOLOGY   Official radiology report(s): No results  found.  ____________________________________________   PROCEDURES  Procedure(s) performed (including Critical Care):  .Critical Care Performed by: Lucrezia Starch, MD Authorized by: Lucrezia Starch, MD   Critical care provider statement:    Critical care time (minutes):  45   Critical care time was exclusive of:  Separately billable procedures and treating other patients   Critical care was necessary to treat or prevent imminent or life-threatening deterioration of the following conditions:  Respiratory failure   Critical care was time spent personally by me on the following activities:  Discussions with consultants, evaluation of patient's response to treatment, examination of patient, ordering and performing treatments and interventions, ordering and review of laboratory studies, ordering and review of radiographic studies, pulse oximetry, re-evaluation of patient's condition, obtaining history from patient or surrogate and review of old charts     ____________________________________________   INITIAL IMPRESSION / [place] / ED COURSE        Patient presents with above to history exam persistent worsening fatigue shortness of breath and cough after recent diagnosis of COVID-19 on 10/28.  Patient also notes he passed out couple of days ago and hit the right side of his face.  Per EMS patient had a room air sat of 60% on arrival and they are able to increase it 90% with a nonrebreather.  On arrival to the emergency room patient's SPO2 saturation is 88% on nonrebreather with 6 L nasal cannula underneath.  He is tachypneic at 30, febrile to 100.6, and hypertensive with a BP of 157/99.  He has coarse decreased bilateral breath sounds.  He also noted to have a ecchymosis over his right eye.  He is otherwise neurovascular intact and no other signs of trauma on exam.  Given report of syncope with ecchymosis over his right eye we will plan to obtain CT head and face and  C-spine to assess for evidence of occult skull fracture or intracranial hemorrhage.  Suspect  patient's hypoxic respiratory failure secondary to his known COVID-19 infection.  CBC shows no evidence of acute anemia or leukocytosis.  CMP shows some mild hyperglycemia with a glucose of 143 and a mild transaminitis but no significant ocular metabolic derangements.  Procalcitonin is slightly elevated 0.4.  Troponin is unremarkable at 6.  Low suspicion for ACS or myocarditis at this time.  We will also plan to obtain CTA chest to assess for evidence of PE and better characterize pneumonia given D-dimer greater than 75,000.  Given patient's elevated procalcitonin fever will also treat for commune acquired pneumonia.  Care of patient signed over to oncoming fire doctor and brother at approximately 1530.  Plan is to follow-up CT imaging and admit to hospital service for further evaluation management.    ____________________________________________   FINAL CLINICAL IMPRESSION(S) / ED DIAGNOSES  Final diagnoses:  COVID-19  Community acquired pneumonia, unspecified laterality  Acute respiratory failure with hypoxia (HCC)    Medications  remdesivir 200 mg in sodium chloride 0.9% 250 mL IVPB (200 mg Intravenous New Bag/Given 01/10/20 1455)    Followed by  remdesivir 100 mg in sodium chloride 0.9 % 100 mL IVPB (has no administration in time range)  levofloxacin (LEVAQUIN) IVPB 750 mg (has no administration in time range)  acetaminophen (TYLENOL) tablet 1,000 mg (1,000 mg Oral Given 01/10/20 1327)  dexamethasone (DECADRON) injection 6 mg (6 mg Intravenous Given 01/10/20 1327)  iohexol (OMNIPAQUE) 350 MG/ML injection 75 mL (75 mLs Intravenous Contrast Given 01/10/20 1510)     ED Discharge Orders    None       Note:  This document was prepared using Dragon voice recognition software and may include unintentional dictation errors.   Lucrezia Starch, MD 01/10/20 1525

## 2020-01-10 NOTE — ED Notes (Signed)
Pt presents to ED via EMS from home with c/o of increasing SOB. Pt was COVID+ on or around 10/27. Wife is also +. RA sat here on arrival was 72%, pt placed on NRB (15L/min) maintaining a O2 sat of 94%. Pt presents febrile with a 100.6 axillary temp. Pt denies pain in chest or any other pain. Pt denies smoking HX. Pt also has c/o of feeling weak. Pt is A&Ox4 at this time.

## 2020-01-10 NOTE — ED Triage Notes (Signed)
Arrived to ER via EMS with resp distress. 60's RA.

## 2020-01-10 NOTE — Discharge Instructions (Addendum)
10 Things You Can Do to Manage Your COVID-19 Symptoms at Home If you have possible or confirmed COVID-19: 1. Stay home from work and school. And stay away from other public places. If you must go out, avoid using any kind of public transportation, ridesharing, or taxis. 2. Monitor your symptoms carefully. If your symptoms get worse, call your healthcare provider immediately. 3. Get rest and stay hydrated. 4. If you have a medical appointment, call the healthcare provider ahead of time and tell them that you have or may have COVID-19. 5. For medical emergencies, call 911 and notify the dispatch personnel that you have or may have COVID-19. 6. Cover your cough and sneezes with a tissue or use the inside of your elbow. 7. Wash your hands often with soap and water for at least 20 seconds or clean your hands with an alcohol-based hand sanitizer that contains at least 60% alcohol. 8. As much as possible, stay in a specific room and away from other people in your home. Also, you should use a separate bathroom, if available. If you need to be around other people in or outside of the home, wear a mask. 9. Avoid sharing personal items with other people in your household, like dishes, towels, and bedding. 10. Clean all surfaces that are touched often, like counters, tabletops, and doorknobs. Use household cleaning sprays or wipes according to the label instructions. [email or web address] 09/03/2018 This information is not intended to replace advice given to you by your health care provider. Make sure you discuss any questions you have with your health care provider.  USE your incentive spirometer and oxygen as instructed

## 2020-01-10 NOTE — ED Notes (Signed)
Pt resting comfortably at this time. Pt currently on hi-flow O2. Pt;s work of breathing has seemed to decreased due toe this. Call bell in reach.

## 2020-01-10 NOTE — ED Notes (Signed)
Patient transported to CT 

## 2020-01-10 NOTE — ED Notes (Signed)
Rt called for hi-flow O2

## 2020-01-11 ENCOUNTER — Telehealth: Payer: Self-pay

## 2020-01-11 DIAGNOSIS — Y92019 Unspecified place in single-family (private) house as the place of occurrence of the external cause: Secondary | ICD-10-CM | POA: Diagnosis not present

## 2020-01-11 DIAGNOSIS — E119 Type 2 diabetes mellitus without complications: Secondary | ICD-10-CM | POA: Diagnosis present

## 2020-01-11 DIAGNOSIS — R739 Hyperglycemia, unspecified: Secondary | ICD-10-CM | POA: Diagnosis not present

## 2020-01-11 DIAGNOSIS — J9601 Acute respiratory failure with hypoxia: Secondary | ICD-10-CM | POA: Diagnosis present

## 2020-01-11 DIAGNOSIS — J1282 Pneumonia due to coronavirus disease 2019: Secondary | ICD-10-CM | POA: Diagnosis present

## 2020-01-11 DIAGNOSIS — Z87891 Personal history of nicotine dependence: Secondary | ICD-10-CM | POA: Diagnosis not present

## 2020-01-11 DIAGNOSIS — Z833 Family history of diabetes mellitus: Secondary | ICD-10-CM | POA: Diagnosis not present

## 2020-01-11 DIAGNOSIS — R748 Abnormal levels of other serum enzymes: Secondary | ICD-10-CM | POA: Diagnosis not present

## 2020-01-11 DIAGNOSIS — T380X5A Adverse effect of glucocorticoids and synthetic analogues, initial encounter: Secondary | ICD-10-CM | POA: Diagnosis not present

## 2020-01-11 DIAGNOSIS — S0011XA Contusion of right eyelid and periocular area, initial encounter: Secondary | ICD-10-CM | POA: Diagnosis present

## 2020-01-11 DIAGNOSIS — J159 Unspecified bacterial pneumonia: Secondary | ICD-10-CM | POA: Diagnosis present

## 2020-01-11 DIAGNOSIS — Z8249 Family history of ischemic heart disease and other diseases of the circulatory system: Secondary | ICD-10-CM | POA: Diagnosis not present

## 2020-01-11 DIAGNOSIS — Z880 Allergy status to penicillin: Secondary | ICD-10-CM | POA: Diagnosis not present

## 2020-01-11 DIAGNOSIS — M1990 Unspecified osteoarthritis, unspecified site: Secondary | ICD-10-CM | POA: Diagnosis present

## 2020-01-11 DIAGNOSIS — E1165 Type 2 diabetes mellitus with hyperglycemia: Secondary | ICD-10-CM | POA: Diagnosis not present

## 2020-01-11 DIAGNOSIS — U071 COVID-19: Secondary | ICD-10-CM | POA: Diagnosis present

## 2020-01-11 DIAGNOSIS — Z888 Allergy status to other drugs, medicaments and biological substances status: Secondary | ICD-10-CM | POA: Diagnosis not present

## 2020-01-11 DIAGNOSIS — Z79899 Other long term (current) drug therapy: Secondary | ICD-10-CM | POA: Diagnosis not present

## 2020-01-11 DIAGNOSIS — Z885 Allergy status to narcotic agent status: Secondary | ICD-10-CM | POA: Diagnosis not present

## 2020-01-11 DIAGNOSIS — R55 Syncope and collapse: Secondary | ICD-10-CM | POA: Diagnosis present

## 2020-01-11 DIAGNOSIS — E785 Hyperlipidemia, unspecified: Secondary | ICD-10-CM | POA: Diagnosis present

## 2020-01-11 DIAGNOSIS — Z791 Long term (current) use of non-steroidal anti-inflammatories (NSAID): Secondary | ICD-10-CM | POA: Diagnosis not present

## 2020-01-11 DIAGNOSIS — G4700 Insomnia, unspecified: Secondary | ICD-10-CM | POA: Diagnosis present

## 2020-01-11 DIAGNOSIS — W1830XA Fall on same level, unspecified, initial encounter: Secondary | ICD-10-CM | POA: Diagnosis present

## 2020-01-11 DIAGNOSIS — N400 Enlarged prostate without lower urinary tract symptoms: Secondary | ICD-10-CM | POA: Diagnosis present

## 2020-01-11 LAB — CBC WITH DIFFERENTIAL/PLATELET
Abs Immature Granulocytes: 0.09 10*3/uL — ABNORMAL HIGH (ref 0.00–0.07)
Basophils Absolute: 0 10*3/uL (ref 0.0–0.1)
Basophils Relative: 0 %
Eosinophils Absolute: 0 10*3/uL (ref 0.0–0.5)
Eosinophils Relative: 0 %
HCT: 46.2 % (ref 39.0–52.0)
Hemoglobin: 15.8 g/dL (ref 13.0–17.0)
Immature Granulocytes: 2 %
Lymphocytes Relative: 13 %
Lymphs Abs: 0.6 10*3/uL — ABNORMAL LOW (ref 0.7–4.0)
MCH: 30.4 pg (ref 26.0–34.0)
MCHC: 34.2 g/dL (ref 30.0–36.0)
MCV: 89 fL (ref 80.0–100.0)
Monocytes Absolute: 0.3 10*3/uL (ref 0.1–1.0)
Monocytes Relative: 5 %
Neutro Abs: 4 10*3/uL (ref 1.7–7.7)
Neutrophils Relative %: 80 %
Platelets: 150 10*3/uL (ref 150–400)
RBC: 5.19 MIL/uL (ref 4.22–5.81)
RDW: 13.2 % (ref 11.5–15.5)
WBC: 5 10*3/uL (ref 4.0–10.5)
nRBC: 0 % (ref 0.0–0.2)

## 2020-01-11 LAB — COMPREHENSIVE METABOLIC PANEL
ALT: 55 U/L — ABNORMAL HIGH (ref 0–44)
AST: 55 U/L — ABNORMAL HIGH (ref 15–41)
Albumin: 2.3 g/dL — ABNORMAL LOW (ref 3.5–5.0)
Alkaline Phosphatase: 73 U/L (ref 38–126)
Anion gap: 10 (ref 5–15)
BUN: 35 mg/dL — ABNORMAL HIGH (ref 8–23)
CO2: 21 mmol/L — ABNORMAL LOW (ref 22–32)
Calcium: 8.1 mg/dL — ABNORMAL LOW (ref 8.9–10.3)
Chloride: 104 mmol/L (ref 98–111)
Creatinine, Ser: 1.04 mg/dL (ref 0.61–1.24)
GFR, Estimated: >60 mL/min (ref >60)
Glucose, Bld: 134 mg/dL — ABNORMAL HIGH (ref 70–99)
Potassium: 4.3 mmol/L (ref 3.5–5.1)
Sodium: 135 mmol/L (ref 135–145)
Total Bilirubin: 1.2 mg/dL (ref 0.3–1.2)
Total Protein: 6.6 g/dL (ref 6.5–8.1)

## 2020-01-11 LAB — FIBRIN DERIVATIVES D-DIMER (ARMC ONLY): Fibrin derivatives D-dimer (ARMC): >7500 ng/mL (FEU) — ABNORMAL HIGH (ref 0.00–499.00)

## 2020-01-11 LAB — BRAIN NATRIURETIC PEPTIDE: B Natriuretic Peptide: 67.7 pg/mL (ref 0.0–100.0)

## 2020-01-11 MED ORDER — BARICITINIB 2 MG PO TABS
4.0000 mg | ORAL_TABLET | Freq: Every day | ORAL | Status: DC
  Administered 2020-01-11 – 2020-01-18 (×9): 4 mg via ORAL
  Filled 2020-01-11 (×11): qty 2

## 2020-01-11 MED ORDER — METHYLPREDNISOLONE SODIUM SUCC 125 MG IJ SOLR
60.0000 mg | Freq: Two times a day (BID) | INTRAMUSCULAR | Status: DC
  Administered 2020-01-11 – 2020-01-13 (×4): 60 mg via INTRAVENOUS
  Filled 2020-01-11 (×4): qty 2

## 2020-01-11 NOTE — ED Notes (Signed)
Breakfast tray ordered for pt

## 2020-01-11 NOTE — Telephone Encounter (Signed)
[place] Night - Client TELEPHONE ADVICE RECORD [facility] Patient Name: Steven Hall Gender: Male DOB: 08/09/1940 Age: 79 Y 93 M 25 D Return Phone Number: [phone] (Primary), [phone] (Secondary) Address: City/State/Zip: [place] [address] Client [place] Night - Client Client Site [place] Physician Renford Dills - MD Contact Type Call Who Is Calling Patient / Member / Family / Caregiver Call Type Triage / Clinical Caller Name Damon Bari Relationship To Patient Son Return Phone Number [phone] (Primary) Chief Complaint Face Injury Reason for Call Symptomatic / Request for [place] states his fathers wife had open heart surgery 6 weeks ago and his dad has been taking care of her. They both now have covid. He states his dad is not doing well trying to take care of her. He has fallen and has black eye and looks like he has lost 10 lbs. They had EMS go check onthem today but they refused to take them to the hospital. States he is concerned and would like to speak to the doctor. Translation No Nurse Assessment Nurse: Valentino Nose, RN, [place] Date/Time (Eastern Time): 01/09/2020 5:02:55 PM Confirm and document reason for call. If symptomatic, describe symptoms. ---Caller states his father has been trying to take care of his wife with having COVID. Passed out Thursday and now has a black eye. Wants to speak to MD to see about getting home care. Tested positive for COVID the Thurs before last. Does the patient have any new or worsening symptoms? ---Yes Will a triage be completed? ---Yes Related visit to physician within the last 2 weeks? ---No Does the PT have any chronic conditions? (i.e. diabetes, asthma, this includes High risk factors for pregnancy, etc.) ---No Is this a behavioral health or substance abuse call? ---No Guidelines Guideline Title  Affirmed Question Affirmed Notes Nurse Date/Time Eilene Ghazi Time) Face Injury Face swelling, bruise or pain Valentino Nose, RN, [place] 01/09/2020 5:06:51 PM Disp. Time Eilene Ghazi Time) Disposition Final User 01/09/2020 5:17:30 PM Called On-Call Provider Valentino Nose, RN, [place] PLEASE NOTE: All timestamps contained within this report are represented as [place] Standard Time. CONFIDENTIALTY NOTICE: This fax transmission is intended only for the addressee. It contains information that is legally privileged, confidential or otherwise protected from use or disclosure. If you are not the intended recipient, you are strictly prohibited from reviewing, disclosing, copying using or disseminating any of this information or taking any action in reliance on or regarding this information. If you have received this fax in error, please notify us immediately by telephone so that we can arrange for its return to [place]. Phone: [phone], Toll-Free: [phone], Fax: [phone] Page: 2 of 2 Call Id: [id number] 01/09/2020 5:18:04 [place], RN, Arnetha Courser Disagree/Comply Comply Caller Understands Yes PreDisposition Call Doctor Care Advice Given Per Guideline HOME CARE: Paging DoctorName Phone DateTime Result/Outcome Message Type Notes Renford Dills - MD [phone] 01/09/2020 5:17:30 PM Called On Call Provider - Reached Doctor Paged Renford Dills - MD 01/09/2020 5:17:58 PM Spoke with On Call - General Message Result Conference call OCP with pt.

## 2020-01-11 NOTE — ED Notes (Signed)
Requested regular bed @6 :39

## 2020-01-11 NOTE — ED Notes (Signed)
A called and notified that patient will be on the way in 5 minutes.

## 2020-01-11 NOTE — Telephone Encounter (Signed)
Awaiting inpatient notes.  Thanks.

## 2020-01-11 NOTE — Progress Notes (Signed)
PROGRESS NOTE    Steven Hall  HUD:[id number] DOB: [birth year] DOA: 01/10/2020 PCP: Tonia Ghent, MD   Chief complaint shortness of breath. Brief Narrative:   Steven Hall is a 79 y.o. male with medical history significant for BPH, hyperlipidemia on atorvastatin 10 mg daily presented to the emergency department at the urging of his son for worsening shortness of breath. He was diagnosed with COVID-19 on 12/31/2019.  He states that he has not gotten his Covid vaccine.  Patient has severe respite failure, is placed on 100% heated high flow oxygen.  He is also placed on steroids, remdesivir and baricitinib.  He has a mild elevation of procalcitonin level at 0.4, start antibiotics with Zithromax and Rocephin.   Assessment & Plan:   Principal Problem:   Acute hypoxemic respiratory failure due to COVID-19 [facility]) Active Problems:   HLD (hyperlipidemia)   Pneumonia due to COVID-19 virus   BPH (benign prostatic hyperplasia)   Traumatic periorbital ecchymosis of right eye  #1.  Acute hypoxemic respiratory failure secondary to COVID-19 pneumonia. Multilobar pneumonia secondary to Covid infection. Secondary bacterial pneumonia. Patient condition is critical, currently he is on 100% heated high flow oxygen.  Condition can deteriorate quickly, will monitor closely in the progressive unit.  Transfer to ICU if needed. Continue remdesivir and baricitinib. Mild elevation of procalcitonin level, continue antibiotics with Rocephin and Zithromax. Currently on Lovenox for prophylaxis.  Check a D-dimer tomorrow, if significant elevated, will start therapeutic anticoagulation.  #2.  Syncope. Patient had a syncope episode due to hypoxemia.  Continue to follow.  #3.  Liver function changes. Secondary to Covid.  Follow.    DVT prophylaxis: Lovenox Code Status: Full Family Communication: None Disposition Plan:  .   Status is: Inpatient  Remains inpatient appropriate because:Inpatient level  of care appropriate due to severity of illness   Dispo: The patient is from: Home              Anticipated d/c is to: Home              Anticipated d/c date is: > 3 days              Patient currently is not medically stable to d/c.        I/O last 3 completed shifts: In: 650 [IV Piggyback:650] Out: 700 [Urine:700] Total I/O In: 350 [IV Piggyback:350] Out: 250 [Urine:250]     Consultants:   None  Procedures: None  Antimicrobials:  Rocephin and Zithromax.  Subjective: Patient still on 100% heated high flow oxygen.  Short of breath with exertion. Cough, nonproductive. No fever or chills. No abdominal pain nausea vomiting.  No diarrhea. No headache or dizziness. No dysuria hematuria.  Objective: Vitals:   01/11/20 1040 01/11/20 1100 01/11/20 1200 01/11/20 1437  BP:  119/83 132/89 130/84  Pulse:  74 80 74  Resp:   (!) 24 16  Temp:    97.9 F (36.6 C)  TempSrc:    Oral  SpO2: 97% 99% 98% 97%  Weight:  74.8 kg    Height:  5\' 7"  (1.702 m)      Intake/Output Summary (Last 24 hours) at 01/11/2020 1514 Last data filed at 01/11/2020 1243 Gross per 24 hour  Intake 1000 ml  Output 950 ml  Net 50 ml   Filed Weights   01/11/20 1100  Weight: 74.8 kg    Examination:  General exam: Appears calm and comfortable.  Appear acutely ill. Respiratory system: Clear to auscultation.  Respiratory effort normal. Cardiovascular system: S1 & S2 heard, RRR. No JVD, murmurs, rubs, gallops or clicks. No pedal edema. Gastrointestinal system: Abdomen is nondistended, soft and nontender. No organomegaly or masses felt. Normal bowel sounds heard. Central nervous system: Alert and oriented. No focal neurological deficits. Extremities: Symmetric  Skin: No rashes, lesions or ulcers Psychiatry: Mood & affect appropriate.     Data Reviewed: I have personally reviewed following labs and imaging studies  CBC: Recent Labs  Lab 01/10/20 1332 01/11/20 0425  WBC 4.8 5.0  NEUTROABS  3.8 4.0  HGB 18.1* 15.8  HCT 53.0* 46.2  MCV 88.3 89.0  PLT 157 229   Basic Metabolic Panel: Recent Labs  Lab 01/10/20 1332 01/11/20 0425  NA 136 135  K 4.1 4.3  CL 98 104  CO2 23 21*  GLUCOSE 143* 134*  BUN 30* 35*  CREATININE 1.20 1.04  CALCIUM 8.4* 8.1*  MG 2.7*  --    GFR: Estimated Creatinine Clearance: 54.7 mL/min (by C-G formula based on SCr of 1.04 mg/dL). Liver Function Tests: Recent Labs  Lab 01/10/20 1332 01/11/20 0425  AST 74* 55*  ALT 71* 55*  ALKPHOS 94 73  BILITOT 2.1* 1.2  PROT 7.8 6.6  ALBUMIN 2.9* 2.3*   No results for input(s): LIPASE, AMYLASE in the last 168 hours. No results for input(s): AMMONIA in the last 168 hours. Coagulation Profile: No results for input(s): INR, PROTIME in the last 168 hours. Cardiac Enzymes: No results for input(s): CKTOTAL, CKMB, CKMBINDEX, TROPONINI in the last 168 hours. BNP (last 3 results) No results for input(s): PROBNP in the last 8760 hours. HbA1C: No results for input(s): HGBA1C in the last 72 hours. CBG: No results for input(s): GLUCAP in the last 168 hours. Lipid Profile: No results for input(s): CHOL, HDL, LDLCALC, TRIG, CHOLHDL, LDLDIRECT in the last 72 hours. Thyroid Function Tests: No results for input(s): TSH, T4TOTAL, FREET4, T3FREE, THYROIDAB in the last 72 hours. Anemia Panel: No results for input(s): VITAMINB12, FOLATE, FERRITIN, TIBC, IRON, RETICCTPCT in the last 72 hours. Sepsis Labs: Recent Labs  Lab 01/10/20 1332  PROCALCITON 0.40    Recent Results (from the past 240 hour(s))  Blood culture (routine x 2)     Status: None (Preliminary result)   Collection Time: 01/10/20  1:28 PM   Specimen: BLOOD  Result Value Ref Range Status   Specimen Description BLOOD RAC  Final   Special Requests   Final    BOTTLES DRAWN AEROBIC AND ANAEROBIC Blood Culture adequate volume   Culture   Final    NO GROWTH < 12 HOURS Performed at [facility], [address] [place], [place]  [address]    Report Status PENDING  Incomplete  Blood culture (routine x 2)     Status: None (Preliminary result)   Collection Time: 01/10/20  1:28 PM   Specimen: BLOOD  Result Value Ref Range Status   Specimen Description BLOOD LAC  Final   Special Requests   Final    BOTTLES DRAWN AEROBIC AND ANAEROBIC Blood Culture adequate volume   Culture   Final    NO GROWTH < 12 HOURS Performed at [facility], [address] [place], [place] [address]    Report Status PENDING  Incomplete         Radiology Studies: CT Head Wo Contrast  Result Date: 01/10/2020 CLINICAL DATA:  COVID-19 positive, respiratory distress, fell EXAM: CT HEAD WITHOUT CONTRAST CT MAXILLOFACIAL WITHOUT CONTRAST CT CERVICAL SPINE WITHOUT CONTRAST  TECHNIQUE: Multidetector CT imaging of the head, cervical spine, and maxillofacial structures were performed using the standard protocol without intravenous contrast. Multiplanar CT image reconstructions of the cervical spine and maxillofacial structures were also generated. COMPARISON:  None. FINDINGS: CT HEAD FINDINGS Brain: No acute infarct or hemorrhage. Lateral ventricles and midline structures appear unremarkable. No acute extra-axial fluid collections. No mass effect. Vascular: No hyperdense vessel or unexpected calcification. Skull: Normal. Negative for fracture or focal lesion. Other: None. CT MAXILLOFACIAL FINDINGS Osseous: No fracture or mandibular dislocation. No destructive process. Orbits: Negative. No traumatic or inflammatory finding. Sinuses: Minimal mucosal thickening within the anterior ethmoid air cells. Soft tissues: Negative. CT CERVICAL SPINE FINDINGS Alignment: Alignment is grossly anatomic. Skull base and vertebrae: No acute fracture. No primary bone lesion or focal pathologic process. Soft tissues and spinal canal: No prevertebral fluid or swelling. No visible canal hematoma. Disc levels: Multilevel facet hypertrophy greatest from C2 through C5, right  greater than left. This results and right greater than left neural foraminal narrowing at C2-3, C3-4, and C4-5. Disc spaces are relatively well preserved. Upper chest: Airways patent. Widespread biapical ground-glass airspace disease consistent with given history of COVID-19. Other: Reconstructed images demonstrate no additional findings. IMPRESSION: 1. No acute intracranial process. 2. No acute facial bone fracture.  Minimal ethmoid sinus disease. 3. No acute cervical spine fracture. Multilevel cervical facet hypertrophy as above. 4. Bilateral upper lobe airspace disease consistent with COVID 19 pneumonia. Electronically Signed   By: Randa Ngo M.D.   On: 01/10/2020 15:55   CT Angio Chest PE W and/or Wo Contrast  Result Date: 01/10/2020 CLINICAL DATA:  Respiratory distress, COVID positive EXAM: CT ANGIOGRAPHY CHEST WITH CONTRAST TECHNIQUE: Multidetector CT imaging of the chest was performed using the standard protocol during bolus administration of intravenous contrast. Multiplanar CT image reconstructions and MIPs were obtained to evaluate the vascular anatomy. CONTRAST:  58mL OMNIPAQUE IOHEXOL 350 MG/ML SOLN COMPARISON:  None. FINDINGS: Cardiovascular: There is a optimal opacification of the pulmonary arteries. There is no central,segmental, or subsegmental filling defects within the pulmonary arteries. There is mild cardiomegaly present. No pericardial effusion or thickening. No evidence right heart strain. There is normal three-vessel brachiocephalic anatomy without proximal stenosis. Scattered mild aortic atherosclerosis is seen. Mediastinum/Nodes: No hilar, mediastinal, or axillary adenopathy. Thyroid gland, trachea, and esophagus demonstrate no significant findings. Lungs/Pleura: Multifocal patchy ground-glass airspace opacities are seen throughout both lungs, predominantly within the periphery. No pleural effusion is seen. Upper Abdomen: No acute abnormalities present in the visualized portions of  the upper abdomen. Musculoskeletal: No chest wall abnormality. No acute or significant osseous findings. Review of the MIP images confirms the above findings. IMPRESSION: No central, segmental, or subsegmental pulmonary embolism. Extensive multifocal patchy ground-glass opacities are seen throughout both lungs, predominantly within the periphery, likely consistent with multifocal atypical viral pneumonia. Aortic Atherosclerosis (ICD10-I70.0). Electronically Signed   By: Prudencio Pair M.D.   On: 01/10/2020 15:58   CT Cervical Spine Wo Contrast  Result Date: 01/10/2020 CLINICAL DATA:  COVID-19 positive, respiratory distress, fell EXAM: CT HEAD WITHOUT CONTRAST CT MAXILLOFACIAL WITHOUT CONTRAST CT CERVICAL SPINE WITHOUT CONTRAST TECHNIQUE: Multidetector CT imaging of the head, cervical spine, and maxillofacial structures were performed using the standard protocol without intravenous contrast. Multiplanar CT image reconstructions of the cervical spine and maxillofacial structures were also generated. COMPARISON:  None. FINDINGS: CT HEAD FINDINGS Brain: No acute infarct or hemorrhage. Lateral ventricles and midline structures appear unremarkable. No acute extra-axial fluid collections. No mass effect. Vascular: No  hyperdense vessel or unexpected calcification. Skull: Normal. Negative for fracture or focal lesion. Other: None. CT MAXILLOFACIAL FINDINGS Osseous: No fracture or mandibular dislocation. No destructive process. Orbits: Negative. No traumatic or inflammatory finding. Sinuses: Minimal mucosal thickening within the anterior ethmoid air cells. Soft tissues: Negative. CT CERVICAL SPINE FINDINGS Alignment: Alignment is grossly anatomic. Skull base and vertebrae: No acute fracture. No primary bone lesion or focal pathologic process. Soft tissues and spinal canal: No prevertebral fluid or swelling. No visible canal hematoma. Disc levels: Multilevel facet hypertrophy greatest from C2 through C5, right greater than  left. This results and right greater than left neural foraminal narrowing at C2-3, C3-4, and C4-5. Disc spaces are relatively well preserved. Upper chest: Airways patent. Widespread biapical ground-glass airspace disease consistent with given history of COVID-19. Other: Reconstructed images demonstrate no additional findings. IMPRESSION: 1. No acute intracranial process. 2. No acute facial bone fracture.  Minimal ethmoid sinus disease. 3. No acute cervical spine fracture. Multilevel cervical facet hypertrophy as above. 4. Bilateral upper lobe airspace disease consistent with COVID 19 pneumonia. Electronically Signed   By: Randa Ngo M.D.   On: 01/10/2020 15:55   CT Maxillofacial Wo Contrast  Result Date: 01/10/2020 CLINICAL DATA:  COVID-19 positive, respiratory distress, fell EXAM: CT HEAD WITHOUT CONTRAST CT MAXILLOFACIAL WITHOUT CONTRAST CT CERVICAL SPINE WITHOUT CONTRAST TECHNIQUE: Multidetector CT imaging of the head, cervical spine, and maxillofacial structures were performed using the standard protocol without intravenous contrast. Multiplanar CT image reconstructions of the cervical spine and maxillofacial structures were also generated. COMPARISON:  None. FINDINGS: CT HEAD FINDINGS Brain: No acute infarct or hemorrhage. Lateral ventricles and midline structures appear unremarkable. No acute extra-axial fluid collections. No mass effect. Vascular: No hyperdense vessel or unexpected calcification. Skull: Normal. Negative for fracture or focal lesion. Other: None. CT MAXILLOFACIAL FINDINGS Osseous: No fracture or mandibular dislocation. No destructive process. Orbits: Negative. No traumatic or inflammatory finding. Sinuses: Minimal mucosal thickening within the anterior ethmoid air cells. Soft tissues: Negative. CT CERVICAL SPINE FINDINGS Alignment: Alignment is grossly anatomic. Skull base and vertebrae: No acute fracture. No primary bone lesion or focal pathologic process. Soft tissues and spinal  canal: No prevertebral fluid or swelling. No visible canal hematoma. Disc levels: Multilevel facet hypertrophy greatest from C2 through C5, right greater than left. This results and right greater than left neural foraminal narrowing at C2-3, C3-4, and C4-5. Disc spaces are relatively well preserved. Upper chest: Airways patent. Widespread biapical ground-glass airspace disease consistent with given history of COVID-19. Other: Reconstructed images demonstrate no additional findings. IMPRESSION: 1. No acute intracranial process. 2. No acute facial bone fracture.  Minimal ethmoid sinus disease. 3. No acute cervical spine fracture. Multilevel cervical facet hypertrophy as above. 4. Bilateral upper lobe airspace disease consistent with COVID 19 pneumonia. Electronically Signed   By: Randa Ngo M.D.   On: 01/10/2020 15:55        Scheduled Meds: . atorvastatin  10 mg Oral QHS  . baricitinib  4 mg Oral Daily  . dexamethasone (DECADRON) injection  6 mg Intravenous Q24H  . enoxaparin (LOVENOX) injection  40 mg Subcutaneous Q24H  . finasteride  5 mg Oral Daily  . tamsulosin  0.4 mg Oral Daily   Continuous Infusions: . azithromycin    . doxycycline (VIBRAMYCIN) IV Stopped (01/11/20 1243)  . remdesivir 100 mg in NS 100 mL Stopped (01/11/20 1002)     LOS: 0 days    Time spent: 34 minutes    Sharen Hones, MD [facility]   To contact the attending provider between 7A-7P or the covering provider during after hours 7P-7A, please log into the web site [email or web address] and access using universal [facility] password for that web site. If you do not have the password, please call the hospital operator.  01/11/2020, 3:14 PM

## 2020-01-11 NOTE — ED Notes (Signed)
Unable to give report at this time due to charge nurse not available on the floor at this time.

## 2020-01-11 NOTE — ED Notes (Signed)
Breakfast tray delivered and set up for pt. Remains on HFNC, sats 97% during meal

## 2020-01-11 NOTE — Telephone Encounter (Signed)
Per chart review tab pt was seen at [facility] ED and admitted on 01/10/20.

## 2020-01-11 NOTE — ED Notes (Signed)
Hospital bed requested for pt. 

## 2020-01-12 DIAGNOSIS — R55 Syncope and collapse: Secondary | ICD-10-CM | POA: Diagnosis not present

## 2020-01-12 DIAGNOSIS — R739 Hyperglycemia, unspecified: Secondary | ICD-10-CM | POA: Diagnosis not present

## 2020-01-12 DIAGNOSIS — T50905A Adverse effect of unspecified drugs, medicaments and biological substances, initial encounter: Secondary | ICD-10-CM

## 2020-01-12 DIAGNOSIS — R748 Abnormal levels of other serum enzymes: Secondary | ICD-10-CM | POA: Diagnosis not present

## 2020-01-12 DIAGNOSIS — U071 COVID-19: Secondary | ICD-10-CM | POA: Diagnosis not present

## 2020-01-12 LAB — CBC WITH DIFFERENTIAL/PLATELET
Abs Immature Granulocytes: 0.16 10*3/uL — ABNORMAL HIGH (ref 0.00–0.07)
Basophils Absolute: 0 10*3/uL (ref 0.0–0.1)
Basophils Relative: 0 %
Eosinophils Absolute: 0 10*3/uL (ref 0.0–0.5)
Eosinophils Relative: 0 %
HCT: 44.1 % (ref 39.0–52.0)
Hemoglobin: 15.1 g/dL (ref 13.0–17.0)
Immature Granulocytes: 2 %
Lymphocytes Relative: 7 %
Lymphs Abs: 0.6 10*3/uL — ABNORMAL LOW (ref 0.7–4.0)
MCH: 30.4 pg (ref 26.0–34.0)
MCHC: 34.2 g/dL (ref 30.0–36.0)
MCV: 88.9 fL (ref 80.0–100.0)
Monocytes Absolute: 0.3 10*3/uL (ref 0.1–1.0)
Monocytes Relative: 4 %
Neutro Abs: 6.8 10*3/uL (ref 1.7–7.7)
Neutrophils Relative %: 87 %
Platelets: 140 10*3/uL — ABNORMAL LOW (ref 150–400)
RBC: 4.96 MIL/uL (ref 4.22–5.81)
RDW: 13.4 % (ref 11.5–15.5)
WBC: 7.9 10*3/uL (ref 4.0–10.5)
nRBC: 0 % (ref 0.0–0.2)

## 2020-01-12 LAB — COMPREHENSIVE METABOLIC PANEL
ALT: 43 U/L (ref 0–44)
AST: 42 U/L — ABNORMAL HIGH (ref 15–41)
Albumin: 2.1 g/dL — ABNORMAL LOW (ref 3.5–5.0)
Alkaline Phosphatase: 61 U/L (ref 38–126)
Anion gap: 9 (ref 5–15)
BUN: 37 mg/dL — ABNORMAL HIGH (ref 8–23)
CO2: 22 mmol/L (ref 22–32)
Calcium: 7.9 mg/dL — ABNORMAL LOW (ref 8.9–10.3)
Chloride: 104 mmol/L (ref 98–111)
Creatinine, Ser: 1.07 mg/dL (ref 0.61–1.24)
GFR, Estimated: >60 mL/min (ref >60)
Glucose, Bld: 237 mg/dL — ABNORMAL HIGH (ref 70–99)
Potassium: 4.5 mmol/L (ref 3.5–5.1)
Sodium: 135 mmol/L (ref 135–145)
Total Bilirubin: 0.9 mg/dL (ref 0.3–1.2)
Total Protein: 5.8 g/dL — ABNORMAL LOW (ref 6.5–8.1)

## 2020-01-12 LAB — HEMOGLOBIN A1C
Hgb A1c MFr Bld: 7.2 % — ABNORMAL HIGH (ref 4.8–5.6)
Mean Plasma Glucose: 159.94 mg/dL

## 2020-01-12 LAB — PROCALCITONIN: Procalcitonin: 0.2 ng/mL

## 2020-01-12 LAB — GLUCOSE, CAPILLARY
Glucose-Capillary: 266 mg/dL — ABNORMAL HIGH (ref 70–99)
Glucose-Capillary: 168 mg/dL — ABNORMAL HIGH (ref 70–99)

## 2020-01-12 LAB — FIBRIN DERIVATIVES D-DIMER (ARMC ONLY): Fibrin derivatives D-dimer (ARMC): >7500 ng/mL (FEU) — ABNORMAL HIGH (ref 0.00–499.00)

## 2020-01-12 LAB — MAGNESIUM: Magnesium: 2.5 mg/dL — ABNORMAL HIGH (ref 1.7–2.4)

## 2020-01-12 MED ORDER — SODIUM CHLORIDE 0.9 % IV SOLN
1.0000 g | INTRAVENOUS | Status: DC
  Administered 2020-01-12 – 2020-01-14 (×3): 1 g via INTRAVENOUS
  Filled 2020-01-12 (×6): qty 10

## 2020-01-12 MED ORDER — INSULIN ASPART 100 UNIT/ML ~~LOC~~ SOLN
0.0000 [IU] | Freq: Three times a day (TID) | SUBCUTANEOUS | Status: DC
  Administered 2020-01-12: 5 [IU] via SUBCUTANEOUS
  Administered 2020-01-13 (×2): 2 [IU] via SUBCUTANEOUS
  Administered 2020-01-13 – 2020-01-14 (×2): 5 [IU] via SUBCUTANEOUS
  Administered 2020-01-14: 2 [IU] via SUBCUTANEOUS
  Administered 2020-01-15 – 2020-01-16 (×4): 3 [IU] via SUBCUTANEOUS
  Administered 2020-01-16: 1 [IU] via SUBCUTANEOUS
  Administered 2020-01-17 – 2020-01-18 (×3): 3 [IU] via SUBCUTANEOUS
  Administered 2020-01-18: 09:00:00 2 [IU] via SUBCUTANEOUS
  Administered 2020-01-19 (×2): 5 [IU] via SUBCUTANEOUS
  Administered 2020-01-19: 3 [IU] via SUBCUTANEOUS
  Filled 2020-01-12 (×19): qty 1

## 2020-01-12 MED ORDER — APIXABAN 5 MG PO TABS
5.0000 mg | ORAL_TABLET | Freq: Two times a day (BID) | ORAL | Status: DC
  Administered 2020-01-12 – 2020-01-13 (×3): 5 mg via ORAL
  Filled 2020-01-12 (×3): qty 1

## 2020-01-12 MED ORDER — ALUM & MAG HYDROXIDE-SIMETH 200-200-20 MG/5ML PO SUSP
30.0000 mL | ORAL | Status: DC | PRN
  Administered 2020-01-12 – 2020-01-20 (×7): 30 mL via ORAL
  Filled 2020-01-12 (×7): qty 30

## 2020-01-12 NOTE — Progress Notes (Signed)
PROGRESS NOTE    Steven Hall  AYT:[id number] DOB: [birth year] DOA: 01/10/2020 PCP: Steven Ghent, MD   Chief complaint: shortness of breath. Brief Narrative:  Steven Hall a 79 y.o.malewith medical history significant forBPH, hyperlipidemia on atorvastatin 10 mg daily presented to the emergency department at the urging of his son for worsening shortness of breath. He was diagnosed with COVID-19 on 12/31/2019. He states that he has not gotten his Covid vaccine.  Patient has severe respite failure, is placed on 100% heated high flow oxygen.  He is also placed on steroids, remdesivir and baricitinib.  He has a mild elevation of procalcitonin level at 0.4, start antibiotics with Zithromax and Rocephin.   Assessment & Plan:   Principal Problem:   Acute hypoxemic respiratory failure due to COVID-19 Steven Hall) Active Problems:   HLD (hyperlipidemia)   Pneumonia due to COVID-19 virus   BPH (benign prostatic hyperplasia)   Traumatic periorbital ecchymosis of right eye   Elevated liver enzymes  #1.  Acute hypoxemic respiratory failure secondary to COVID-19 pneumonia. Multilobar pneumonia secondary to Covid infection. Secondary bacterial pneumonia. Patient condition still critical, on heated flow at 100% at 55 liters.  Currently no respite distress. Continue Solu-Medrol, remdesivir and baricitinib. Mild elevation of procalcitonin level, antibiotic started with Rocephin with Zithromax. Significant elevated D-dimer, on Eliquis. Patient has no evidence of volume overload.  #2. syncope. Secondary to hypoxemia.  3.  Liver function changes. Secondary to Covid.  Follow.  #4.  Hyperglycemia secondary to steroid use. Start sliding scale insulin.    DVT prophylaxis: eliquis Code Status: full Family Communication: Steven Hall updated Disposition Plan:  .   Status is: Inpatient  Remains inpatient appropriate because:Inpatient level of care appropriate due to severity of  illness   Dispo: The patient is from: Home              Anticipated d/c is to: Home              Anticipated d/c date is: > 3 days              Patient currently is not medically stable to d/c.        I/O last 3 completed shifts: In: 75 [P.O.:120; IV Piggyback:750] Out: 1450 [Urine:1450] Total I/O In: 360 [P.O.:360] Out: 83 [Urine:275]     Consultants:   none  Procedures: none  Antimicrobials:  Rocephin, zithromax  Subjective: Patient still has severe hypoxemia, requiring maximum oxygen support.  He is comfortable, no segment short of breath at rest.  He has a cough, with some yellow mucus. He denies any abdominal pain or nausea vomiting.  No diarrhea constipation. No fever or chills. No dysuria hematuria.  Objective: Vitals:   01/12/20 0416 01/12/20 0418 01/12/20 0745 01/12/20 1128  BP:  116/79 131/90 128/79  Pulse:  70 70 69  Resp:  20 20 20   Temp:  98.3 F (36.8 C) 98.3 F (36.8 C) 97.8 F (36.6 C)  TempSrc:  Oral Oral Oral  SpO2:  94% 96% 98%  Weight: 68.1 kg     Height:        Intake/Output Summary (Last 24 hours) at 01/12/2020 1219 Last data filed at 01/12/2020 1028 Gross per 24 hour  Intake 730 ml  Output 775 ml  Net -45 ml   Filed Weights   01/11/20 1100 01/12/20 0416  Weight: 74.8 kg 68.1 kg    Examination:  General exam: Appears calm and comfortable  Respiratory system: Clear to auscultation. Respiratory  effort normal. Cardiovascular system: S1 & S2 heard, RRR. No JVD, murmurs, rubs, gallops or clicks. No pedal edema. Gastrointestinal system: Abdomen is nondistended, soft and nontender. No organomegaly or masses felt. Normal bowel sounds heard. Central nervous system: Alert and oriented x3. No focal neurological deficits. Extremities: Symmetric 5 x 5 power. Skin: No rashes, lesions or ulcers Psychiatry:  Mood & affect appropriate.     Data Reviewed: I have personally reviewed following labs and imaging studies  CBC: Recent  Labs  Lab 01/10/20 1332 01/11/20 0425 01/12/20 0503  WBC 4.8 5.0 7.9  NEUTROABS 3.8 4.0 6.8  HGB 18.1* 15.8 15.1  HCT 53.0* 46.2 44.1  MCV 88.3 89.0 88.9  PLT 157 150 048*   Basic Metabolic Panel: Recent Labs  Lab 01/10/20 1332 01/11/20 0425 01/12/20 0503  NA 136 135 135  K 4.1 4.3 4.5  CL 98 104 104  CO2 23 21* 22  GLUCOSE 143* 134* 237*  BUN 30* 35* 37*  CREATININE 1.20 1.04 1.07  CALCIUM 8.4* 8.1* 7.9*  MG 2.7*  --  2.5*   GFR: Estimated Creatinine Clearance: 53.2 mL/min (by C-G formula based on SCr of 1.07 mg/dL). Liver Function Tests: Recent Labs  Lab 01/10/20 1332 01/11/20 0425 01/12/20 0503  AST 74* 55* 42*  ALT 71* 55* 43  ALKPHOS 94 73 61  BILITOT 2.1* 1.2 0.9  PROT 7.8 6.6 5.8*  ALBUMIN 2.9* 2.3* 2.1*   No results for input(s): LIPASE, AMYLASE in the last 168 hours. No results for input(s): AMMONIA in the last 168 hours. Coagulation Profile: No results for input(s): INR, PROTIME in the last 168 hours. Cardiac Enzymes: No results for input(s): CKTOTAL, CKMB, CKMBINDEX, TROPONINI in the last 168 hours. BNP (last 3 results) No results for input(s): PROBNP in the last 8760 hours. HbA1C: No results for input(s): HGBA1C in the last 72 hours. CBG: No results for input(s): GLUCAP in the last 168 hours. Lipid Profile: No results for input(s): CHOL, HDL, LDLCALC, TRIG, CHOLHDL, LDLDIRECT in the last 72 hours. Thyroid Function Tests: No results for input(s): TSH, T4TOTAL, FREET4, T3FREE, THYROIDAB in the last 72 hours. Anemia Panel: No results for input(s): VITAMINB12, FOLATE, FERRITIN, TIBC, IRON, RETICCTPCT in the last 72 hours. Sepsis Labs: Recent Labs  Lab 01/10/20 1332 01/12/20 0503  PROCALCITON 0.40 0.20    Recent Results (from the past 240 hour(s))  Blood culture (routine x 2)     Status: None (Preliminary result)   Collection Time: 01/10/20  1:28 PM   Specimen: BLOOD  Result Value Ref Range Status   Specimen Description BLOOD RAC  Final    Special Requests   Final    BOTTLES DRAWN AEROBIC AND ANAEROBIC Blood Culture adequate volume   Culture   Final    NO GROWTH 2 DAYS Performed at [facility], [address] [place], [place] [address]    Report Status PENDING  Incomplete  Blood culture (routine x 2)     Status: None (Preliminary result)   Collection Time: 01/10/20  1:28 PM   Specimen: BLOOD  Result Value Ref Range Status   Specimen Description BLOOD LAC  Final   Special Requests   Final    BOTTLES DRAWN AEROBIC AND ANAEROBIC Blood Culture adequate volume   Culture   Final    NO GROWTH 2 DAYS Performed at [facility], [address], [place], [place] [address]    Report Status PENDING  Incomplete  Radiology Studies: CT Head Wo Contrast  Result Date: 01/10/2020 CLINICAL DATA:  COVID-19 positive, respiratory distress, fell EXAM: CT HEAD WITHOUT CONTRAST CT MAXILLOFACIAL WITHOUT CONTRAST CT CERVICAL SPINE WITHOUT CONTRAST TECHNIQUE: Multidetector CT imaging of the head, cervical spine, and maxillofacial structures were performed using the standard protocol without intravenous contrast. Multiplanar CT image reconstructions of the cervical spine and maxillofacial structures were also generated. COMPARISON:  None. FINDINGS: CT HEAD FINDINGS Brain: No acute infarct or hemorrhage. Lateral ventricles and midline structures appear unremarkable. No acute extra-axial fluid collections. No mass effect. Vascular: No hyperdense vessel or unexpected calcification. Skull: Normal. Negative for fracture or focal lesion. Other: None. CT MAXILLOFACIAL FINDINGS Osseous: No fracture or mandibular dislocation. No destructive process. Orbits: Negative. No traumatic or inflammatory finding. Sinuses: Minimal mucosal thickening within the anterior ethmoid air cells. Soft tissues: Negative. CT CERVICAL SPINE FINDINGS Alignment: Alignment is grossly anatomic. Skull base and vertebrae: No acute fracture. No primary bone  lesion or focal pathologic process. Soft tissues and spinal canal: No prevertebral fluid or swelling. No visible canal hematoma. Disc levels: Multilevel facet hypertrophy greatest from C2 through C5, right greater than left. This results and right greater than left neural foraminal narrowing at C2-3, C3-4, and C4-5. Disc spaces are relatively well preserved. Upper chest: Airways patent. Widespread biapical ground-glass airspace disease consistent with given history of COVID-19. Other: Reconstructed images demonstrate no additional findings. IMPRESSION: 1. No acute intracranial process. 2. No acute facial bone fracture.  Minimal ethmoid sinus disease. 3. No acute cervical spine fracture. Multilevel cervical facet hypertrophy as above. 4. Bilateral upper lobe airspace disease consistent with COVID 19 pneumonia. Electronically Signed   By: Randa Ngo M.D.   On: 01/10/2020 15:55   CT Angio Chest PE W and/or Wo Contrast  Result Date: 01/10/2020 CLINICAL DATA:  Respiratory distress, COVID positive EXAM: CT ANGIOGRAPHY CHEST WITH CONTRAST TECHNIQUE: Multidetector CT imaging of the chest was performed using the standard protocol during bolus administration of intravenous contrast. Multiplanar CT image reconstructions and MIPs were obtained to evaluate the vascular anatomy. CONTRAST:  80mL OMNIPAQUE IOHEXOL 350 MG/ML SOLN COMPARISON:  None. FINDINGS: Cardiovascular: There is a optimal opacification of the pulmonary arteries. There is no central,segmental, or subsegmental filling defects within the pulmonary arteries. There is mild cardiomegaly present. No pericardial effusion or thickening. No evidence right heart strain. There is normal three-vessel brachiocephalic anatomy without proximal stenosis. Scattered mild aortic atherosclerosis is seen. Mediastinum/Nodes: No hilar, mediastinal, or axillary adenopathy. Thyroid gland, trachea, and esophagus demonstrate no significant findings. Lungs/Pleura: Multifocal patchy  ground-glass airspace opacities are seen throughout both lungs, predominantly within the periphery. No pleural effusion is seen. Upper Abdomen: No acute abnormalities present in the visualized portions of the upper abdomen. Musculoskeletal: No chest wall abnormality. No acute or significant osseous findings. Review of the MIP images confirms the above findings. IMPRESSION: No central, segmental, or subsegmental pulmonary embolism. Extensive multifocal patchy ground-glass opacities are seen throughout both lungs, predominantly within the periphery, likely consistent with multifocal atypical viral pneumonia. Aortic Atherosclerosis (ICD10-I70.0). Electronically Signed   By: Prudencio Pair M.D.   On: 01/10/2020 15:58   CT Cervical Spine Wo Contrast  Result Date: 01/10/2020 CLINICAL DATA:  COVID-19 positive, respiratory distress, fell EXAM: CT HEAD WITHOUT CONTRAST CT MAXILLOFACIAL WITHOUT CONTRAST CT CERVICAL SPINE WITHOUT CONTRAST TECHNIQUE: Multidetector CT imaging of the head, cervical spine, and maxillofacial structures were performed using the standard protocol without intravenous contrast. Multiplanar CT image reconstructions of the cervical spine and maxillofacial structures were  also generated. COMPARISON:  None. FINDINGS: CT HEAD FINDINGS Brain: No acute infarct or hemorrhage. Lateral ventricles and midline structures appear unremarkable. No acute extra-axial fluid collections. No mass effect. Vascular: No hyperdense vessel or unexpected calcification. Skull: Normal. Negative for fracture or focal lesion. Other: None. CT MAXILLOFACIAL FINDINGS Osseous: No fracture or mandibular dislocation. No destructive process. Orbits: Negative. No traumatic or inflammatory finding. Sinuses: Minimal mucosal thickening within the anterior ethmoid air cells. Soft tissues: Negative. CT CERVICAL SPINE FINDINGS Alignment: Alignment is grossly anatomic. Skull base and vertebrae: No acute fracture. No primary bone lesion or focal  pathologic process. Soft tissues and spinal canal: No prevertebral fluid or swelling. No visible canal hematoma. Disc levels: Multilevel facet hypertrophy greatest from C2 through C5, right greater than left. This results and right greater than left neural foraminal narrowing at C2-3, C3-4, and C4-5. Disc spaces are relatively well preserved. Upper chest: Airways patent. Widespread biapical ground-glass airspace disease consistent with given history of COVID-19. Other: Reconstructed images demonstrate no additional findings. IMPRESSION: 1. No acute intracranial process. 2. No acute facial bone fracture.  Minimal ethmoid sinus disease. 3. No acute cervical spine fracture. Multilevel cervical facet hypertrophy as above. 4. Bilateral upper lobe airspace disease consistent with COVID 19 pneumonia. Electronically Signed   By: Randa Ngo M.D.   On: 01/10/2020 15:55   CT Maxillofacial Wo Contrast  Result Date: 01/10/2020 CLINICAL DATA:  COVID-19 positive, respiratory distress, fell EXAM: CT HEAD WITHOUT CONTRAST CT MAXILLOFACIAL WITHOUT CONTRAST CT CERVICAL SPINE WITHOUT CONTRAST TECHNIQUE: Multidetector CT imaging of the head, cervical spine, and maxillofacial structures were performed using the standard protocol without intravenous contrast. Multiplanar CT image reconstructions of the cervical spine and maxillofacial structures were also generated. COMPARISON:  None. FINDINGS: CT HEAD FINDINGS Brain: No acute infarct or hemorrhage. Lateral ventricles and midline structures appear unremarkable. No acute extra-axial fluid collections. No mass effect. Vascular: No hyperdense vessel or unexpected calcification. Skull: Normal. Negative for fracture or focal lesion. Other: None. CT MAXILLOFACIAL FINDINGS Osseous: No fracture or mandibular dislocation. No destructive process. Orbits: Negative. No traumatic or inflammatory finding. Sinuses: Minimal mucosal thickening within the anterior ethmoid air cells. Soft tissues:  Negative. CT CERVICAL SPINE FINDINGS Alignment: Alignment is grossly anatomic. Skull base and vertebrae: No acute fracture. No primary bone lesion or focal pathologic process. Soft tissues and spinal canal: No prevertebral fluid or swelling. No visible canal hematoma. Disc levels: Multilevel facet hypertrophy greatest from C2 through C5, right greater than left. This results and right greater than left neural foraminal narrowing at C2-3, C3-4, and C4-5. Disc spaces are relatively well preserved. Upper chest: Airways patent. Widespread biapical ground-glass airspace disease consistent with given history of COVID-19. Other: Reconstructed images demonstrate no additional findings. IMPRESSION: 1. No acute intracranial process. 2. No acute facial bone fracture.  Minimal ethmoid sinus disease. 3. No acute cervical spine fracture. Multilevel cervical facet hypertrophy as above. 4. Bilateral upper lobe airspace disease consistent with COVID 19 pneumonia. Electronically Signed   By: Randa Ngo M.D.   On: 01/10/2020 15:55        Scheduled Meds: . apixaban  5 mg Oral BID  . atorvastatin  10 mg Oral QHS  . baricitinib  4 mg Oral Daily  . finasteride  5 mg Oral Daily  . methylPREDNISolone (SOLU-MEDROL) injection  60 mg Intravenous Q12H  . tamsulosin  0.4 mg Oral Daily   Continuous Infusions: . azithromycin 500 mg (01/11/20 1740)  . cefTRIAXone (ROCEPHIN)  IV 1 g (01/12/20  1130)  . remdesivir 100 mg in NS 100 mL Stopped (01/12/20 1019)     LOS: 1 day    Time spent: 36 minutes    Sharen Hones, MD [facility]   To contact the attending provider between 7A-7P or the covering provider during after hours 7P-7A, please log into the web site [email or web address] and access using universal [place] password for that web site. If you do not have the password, please call the hospital operator.  01/12/2020, 12:19 PM

## 2020-01-12 NOTE — Plan of Care (Signed)

## 2020-01-13 DIAGNOSIS — J9601 Acute respiratory failure with hypoxia: Secondary | ICD-10-CM | POA: Diagnosis not present

## 2020-01-13 DIAGNOSIS — U071 COVID-19: Secondary | ICD-10-CM | POA: Diagnosis not present

## 2020-01-13 LAB — CBC WITH DIFFERENTIAL/PLATELET
Abs Immature Granulocytes: 0.17 10*3/uL — ABNORMAL HIGH (ref 0.00–0.07)
Basophils Absolute: 0 10*3/uL (ref 0.0–0.1)
Basophils Relative: 0 %
Eosinophils Absolute: 0 10*3/uL (ref 0.0–0.5)
Eosinophils Relative: 0 %
HCT: 45.2 % (ref 39.0–52.0)
Hemoglobin: 15.3 g/dL (ref 13.0–17.0)
Immature Granulocytes: 2 %
Lymphocytes Relative: 6 %
Lymphs Abs: 0.6 10*3/uL — ABNORMAL LOW (ref 0.7–4.0)
MCH: 30.3 pg (ref 26.0–34.0)
MCHC: 33.8 g/dL (ref 30.0–36.0)
MCV: 89.5 fL (ref 80.0–100.0)
Monocytes Absolute: 0.4 10*3/uL (ref 0.1–1.0)
Monocytes Relative: 4 %
Neutro Abs: 9.7 10*3/uL — ABNORMAL HIGH (ref 1.7–7.7)
Neutrophils Relative %: 88 %
Platelets: 143 10*3/uL — ABNORMAL LOW (ref 150–400)
RBC: 5.05 MIL/uL (ref 4.22–5.81)
RDW: 13.3 % (ref 11.5–15.5)
WBC: 10.9 10*3/uL — ABNORMAL HIGH (ref 4.0–10.5)
nRBC: 0 % (ref 0.0–0.2)

## 2020-01-13 LAB — COMPREHENSIVE METABOLIC PANEL
ALT: 41 U/L (ref 0–44)
AST: 41 U/L (ref 15–41)
Albumin: 2.3 g/dL — ABNORMAL LOW (ref 3.5–5.0)
Alkaline Phosphatase: 61 U/L (ref 38–126)
Anion gap: 6 (ref 5–15)
BUN: 34 mg/dL — ABNORMAL HIGH (ref 8–23)
CO2: 24 mmol/L (ref 22–32)
Calcium: 7.9 mg/dL — ABNORMAL LOW (ref 8.9–10.3)
Chloride: 104 mmol/L (ref 98–111)
Creatinine, Ser: 0.87 mg/dL (ref 0.61–1.24)
GFR, Estimated: >60 mL/min (ref >60)
Glucose, Bld: 215 mg/dL — ABNORMAL HIGH (ref 70–99)
Potassium: 5.2 mmol/L — ABNORMAL HIGH (ref 3.5–5.1)
Sodium: 134 mmol/L — ABNORMAL LOW (ref 135–145)
Total Bilirubin: 0.8 mg/dL (ref 0.3–1.2)
Total Protein: 5.9 g/dL — ABNORMAL LOW (ref 6.5–8.1)

## 2020-01-13 LAB — C-REACTIVE PROTEIN: CRP: 4.1 mg/dL — ABNORMAL HIGH (ref <1.0)

## 2020-01-13 LAB — GLUCOSE, CAPILLARY
Glucose-Capillary: 186 mg/dL — ABNORMAL HIGH (ref 70–99)
Glucose-Capillary: 185 mg/dL — ABNORMAL HIGH (ref 70–99)
Glucose-Capillary: 268 mg/dL — ABNORMAL HIGH (ref 70–99)

## 2020-01-13 LAB — MAGNESIUM: Magnesium: 2.5 mg/dL — ABNORMAL HIGH (ref 1.7–2.4)

## 2020-01-13 LAB — FIBRIN DERIVATIVES D-DIMER (ARMC ONLY): Fibrin derivatives D-dimer (ARMC): >7500 ng/mL (FEU) — ABNORMAL HIGH (ref 0.00–499.00)

## 2020-01-13 MED ORDER — INSULIN DETEMIR 100 UNIT/ML ~~LOC~~ SOLN
10.0000 [IU] | Freq: Every day | SUBCUTANEOUS | Status: DC
  Administered 2020-01-13 – 2020-01-14 (×2): 10 [IU] via SUBCUTANEOUS
  Filled 2020-01-13 (×4): qty 0.1

## 2020-01-13 MED ORDER — SODIUM CHLORIDE 0.9 % IV SOLN: INTRAVENOUS | Status: DC | PRN

## 2020-01-13 MED ORDER — ENOXAPARIN SODIUM 40 MG/0.4ML ~~LOC~~ SOLN
40.0000 mg | SUBCUTANEOUS | Status: DC
  Administered 2020-01-13 – 2020-01-19 (×7): 40 mg via SUBCUTANEOUS
  Filled 2020-01-13 (×7): qty 0.4

## 2020-01-13 MED ORDER — IPRATROPIUM-ALBUTEROL 20-100 MCG/ACT IN AERS
1.0000 | INHALATION_SPRAY | Freq: Four times a day (QID) | RESPIRATORY_TRACT | Status: DC
  Administered 2020-01-13 – 2020-01-20 (×27): 1 via RESPIRATORY_TRACT
  Filled 2020-01-13 (×3): qty 4

## 2020-01-13 MED ORDER — DEXAMETHASONE 4 MG PO TABS
6.0000 mg | ORAL_TABLET | Freq: Every day | ORAL | Status: DC
  Administered 2020-01-14 – 2020-01-20 (×7): 6 mg via ORAL
  Filled 2020-01-13 (×2): qty 2
  Filled 2020-01-13 (×3): qty 1.5
  Filled 2020-01-13 (×2): qty 2
  Filled 2020-01-13: qty 1.5

## 2020-01-13 NOTE — Plan of Care (Signed)

## 2020-01-13 NOTE — Progress Notes (Signed)
PROGRESS NOTE    Steven Hall  WVP:[id number] DOB: September 01, 1940 DOA: 01/10/2020 PCP: Tonia Ghent, MD   Chief complaint: shortness of breath. Brief Narrative:  Steven Rayl Nanceis a 79 y.o.malewith medical history significant forBPH, hyperlipidemia on atorvastatin 10 mg daily presented to the emergency department at the urging of his son for worsening shortness of breath. He was diagnosed with COVID-19 on 12/31/2019. He states that he has not gotten his Covid vaccine.  Patient has severe respite failure, is placed on 100% heated high flow oxygen.  He is also placed on steroids, remdesivir and baricitinib.  He has a mild elevation of procalcitonin level at 0.4, start antibiotics with Zithromax and Rocephin.   Assessment & Plan:   Principal Problem:   Acute hypoxemic respiratory failure due to COVID-19 [facility]) Active Problems:   HLD (hyperlipidemia)   Hyperglycemia, drug-induced   Pneumonia due to COVID-19 virus   BPH (benign prostatic hyperplasia)   Traumatic periorbital ecchymosis of right eye   Elevated liver enzymes   Syncope and collapse  #1.  Acute hypoxemic respiratory failure  --on heated flow at 100% at 55 liters.  Currently no respite distress. --cont supplemental O2 via heated hf  COVID-19 pneumonia --started on solumedrol.  CRP 4.1 on 11/10 PLAN: --cont Remdesivir --switch to oral decadron tomorrow --cont Baricitinib --start Combivent QID  # Possible superimposed bacterial PNA --procal 0.4, started on Rocephin with Zithromax. --continue empiric ceftriaxone and azithromycin  # significant elevation of D-dimer --D-dimer has been >7500 since presentation. --CTA chest ruled out PE PLAN: --d/c eliquis --resume DVT ppx  syncope. Secondary to hypoxemia.  3.  Mild LFT elevation, resolved Secondary to Covid.    #4.  Hyperglycemia secondary to steroid use. # DM2 --A1c 7.2. --start Levemir 10u daily --SSI   DVT prophylaxis: Lovenox Code Status:  full Family Communication:  Disposition Plan:   Status is: Inpatient  Remains inpatient appropriate because:Inpatient level of care appropriate due to severity of illness   Dispo: The patient is from: Home              Anticipated d/c is to: Home              Anticipated d/c date is: > 3 days              Patient currently is not medically stable to d/c.  On high O2 requirement with heated hf.     I/O last 3 completed shifts: In: 1523.9 [P.O.:960; IV Piggyback:563.9] Out: 2300 [Urine:2300] Total I/O In: -  Out: 250 [Urine:250]     Consultants:   none  Procedures: none  Antimicrobials:  Rocephin, zithromax  Subjective: Pt reported feeling better.  Admitted to some cough.     Objective: Vitals:   01/13/20 0521 01/13/20 0610 01/13/20 0852 01/13/20 1236  BP: 131/87  124/82 (!) 143/88  Pulse: 61  62 67  Resp: (!) 21  19   Temp: 97.8 F (36.6 C)  97.6 F (36.4 C) (!) 97.4 F (36.3 C)  TempSrc: Oral  Oral Oral  SpO2: 100% 96% 99% 100%  Weight: 67.9 kg     Height:        Intake/Output Summary (Last 24 hours) at 01/13/2020 1540 Last data filed at 01/13/2020 0852 Gross per 24 hour  Intake 803.92 ml  Output 1375 ml  Net -571.08 ml   Filed Weights   01/11/20 1100 01/12/20 0416 01/13/20 0521  Weight: 74.8 kg 68.1 kg 67.9 kg    Examination:  Constitutional: NAD, AAOx3 HEENT: conjunctivae and lids normal, EOMI CV: No cyanosis.   RESP: Mild rhonchi, on heated hf Extremities: No effusions, edema in BLE SKIN: warm, dry and intact Neuro: II - XII grossly intact.   Psych: Normal mood and affect.  Appropriate judgement and reason   Data Reviewed: I have personally reviewed following labs and imaging studies  CBC: Recent Labs  Lab 01/10/20 1332 01/11/20 0425 01/12/20 0503 01/13/20 0421  WBC 4.8 5.0 7.9 10.9*  NEUTROABS 3.8 4.0 6.8 9.7*  HGB 18.1* 15.8 15.1 15.3  HCT 53.0* 46.2 44.1 45.2  MCV 88.3 89.0 88.9 89.5  PLT 157 150 140* 143*   Basic  Metabolic Panel: Recent Labs  Lab 01/10/20 1332 01/11/20 0425 01/12/20 0503 01/13/20 0421  NA 136 135 135 134*  K 4.1 4.3 4.5 5.2*  CL 98 104 104 104  CO2 23 21* 22 24  GLUCOSE 143* 134* 237* 215*  BUN 30* 35* 37* 34*  CREATININE 1.20 1.04 1.07 0.87  CALCIUM 8.4* 8.1* 7.9* 7.9*  MG 2.7*  --  2.5* 2.5*   GFR: Estimated Creatinine Clearance: 65.4 mL/min (by C-G formula based on SCr of 0.87 mg/dL). Liver Function Tests: Recent Labs  Lab 01/10/20 1332 01/11/20 0425 01/12/20 0503 01/13/20 0421  AST 74* 55* 42* 41  ALT 71* 55* 43 41  ALKPHOS 94 73 61 61  BILITOT 2.1* 1.2 0.9 0.8  PROT 7.8 6.6 5.8* 5.9*  ALBUMIN 2.9* 2.3* 2.1* 2.3*   No results for input(s): LIPASE, AMYLASE in the last 168 hours. No results for input(s): AMMONIA in the last 168 hours. Coagulation Profile: No results for input(s): INR, PROTIME in the last 168 hours. Cardiac Enzymes: No results for input(s): CKTOTAL, CKMB, CKMBINDEX, TROPONINI in the last 168 hours. BNP (last 3 results) No results for input(s): PROBNP in the last 8760 hours. HbA1C: Recent Labs    01/12/20 0503  HGBA1C 7.2*   CBG: Recent Labs  Lab 01/12/20 1720 01/12/20 2031 01/13/20 0850 01/13/20 1226  GLUCAP 266* 168* 186* 185*   Lipid Profile: No results for input(s): CHOL, HDL, LDLCALC, TRIG, CHOLHDL, LDLDIRECT in the last 72 hours. Thyroid Function Tests: No results for input(s): TSH, T4TOTAL, FREET4, T3FREE, THYROIDAB in the last 72 hours. Anemia Panel: No results for input(s): VITAMINB12, FOLATE, FERRITIN, TIBC, IRON, RETICCTPCT in the last 72 hours. Sepsis Labs: Recent Labs  Lab 01/10/20 1332 01/12/20 0503  PROCALCITON 0.40 0.20    Recent Results (from the past 240 hour(s))  Blood culture (routine x 2)     Status: None (Preliminary result)   Collection Time: 01/10/20  1:28 PM   Specimen: BLOOD  Result Value Ref Range Status   Specimen Description BLOOD RAC  Final   Special Requests   Final    BOTTLES DRAWN  AEROBIC AND ANAEROBIC Blood Culture adequate volume   Culture   Final    NO GROWTH 3 DAYS Performed at [facility], [address] [place], [place] [address]    Report Status PENDING  Incomplete  Blood culture (routine x 2)     Status: None (Preliminary result)   Collection Time: 01/10/20  1:28 PM   Specimen: BLOOD  Result Value Ref Range Status   Specimen Description BLOOD LAC  Final   Special Requests   Final    BOTTLES DRAWN AEROBIC AND ANAEROBIC Blood Culture adequate volume   Culture   Final    NO GROWTH 3 DAYS Performed at [facility], [place]  [address], [place], [place] [address]    Report Status PENDING  Incomplete         Radiology Studies: No results found.      Scheduled Meds: . atorvastatin  10 mg Oral QHS  . baricitinib  4 mg Oral Daily  . enoxaparin (LOVENOX) injection  40 mg Subcutaneous Q24H  . finasteride  5 mg Oral Daily  . insulin aspart  0-9 Units Subcutaneous TID WC  . insulin detemir  10 Units Subcutaneous Daily  . methylPREDNISolone (SOLU-MEDROL) injection  60 mg Intravenous Q12H  . tamsulosin  0.4 mg Oral Daily   Continuous Infusions: . sodium chloride 5 mL/hr at 01/13/20 0940  . azithromycin 500 mg (01/12/20 1729)  . cefTRIAXone (ROCEPHIN)  IV 1 g (01/13/20 1231)  . remdesivir 100 mg in NS 100 mL 100 mg (01/13/20 0942)     LOS: 2 days     Enzo Bi, MD [facility]   To contact the attending provider between 7A-7P or the covering provider during after hours 7P-7A, please log into the web site [email or web address] and access using universal [place] password for that web site. If you do not have the password, please call the hospital operator.  01/13/2020, 3:40 PM

## 2020-01-13 NOTE — Progress Notes (Signed)
Inpatient Diabetes Program Recommendations  AACE/ADA: New Consensus Statement on Inpatient Glycemic Control (2015)  Target Ranges:  Prepandial:   less than 140 mg/dL      Peak postprandial:   less than 180 mg/dL (1-2 hours)      Critically ill patients:  140 - 180 mg/dL   Lab Results  Component Value Date   GLUCAP 186 (H) 01/13/2020   HGBA1C 7.2 (H) 01/12/2020    Review of Glycemic Control Results for MARKOS, THEIL (MRN [id number]) as of 01/13/2020 11:11  Ref. Range 01/12/2020 17:20 01/12/2020 20:31 01/13/2020 08:50  Glucose-Capillary Latest Ref Range: 70 - 99 mg/dL 266 (H) 168 (H) 186 (H)   Diabetes history: None noted- A1C=7.2% (? New diagnosis) Current orders for Inpatient glycemic control:  Novolog sensitive tid with meals Solumedrol 60 mg IV q 12 hours  Inpatient Diabetes Program Recommendations:    No previous history of DM noted, however A1C=7.2%.  Recommend adding Levemir 10 units q AM.   MD, ? New diagnosis of DM.  Will follow regarding educational needs as condition improved.   Thanks,  Adah Perl, RN, BC-ADM Inpatient Diabetes Coordinator Pager [phone] (8a-5p)

## 2020-01-14 DIAGNOSIS — J9601 Acute respiratory failure with hypoxia: Secondary | ICD-10-CM | POA: Diagnosis not present

## 2020-01-14 DIAGNOSIS — U071 COVID-19: Secondary | ICD-10-CM | POA: Diagnosis not present

## 2020-01-14 LAB — CBC
HCT: 46.9 % (ref 39.0–52.0)
Hemoglobin: 15.7 g/dL (ref 13.0–17.0)
MCH: 30 pg (ref 26.0–34.0)
MCHC: 33.5 g/dL (ref 30.0–36.0)
MCV: 89.5 fL (ref 80.0–100.0)
Platelets: 159 10*3/uL (ref 150–400)
RBC: 5.24 MIL/uL (ref 4.22–5.81)
RDW: 13.2 % (ref 11.5–15.5)
WBC: 14.2 10*3/uL — ABNORMAL HIGH (ref 4.0–10.5)
nRBC: 0 % (ref 0.0–0.2)

## 2020-01-14 LAB — BASIC METABOLIC PANEL
Anion gap: 8 (ref 5–15)
BUN: 30 mg/dL — ABNORMAL HIGH (ref 8–23)
CO2: 26 mmol/L (ref 22–32)
Calcium: 8.2 mg/dL — ABNORMAL LOW (ref 8.9–10.3)
Chloride: 102 mmol/L (ref 98–111)
Creatinine, Ser: 1.02 mg/dL (ref 0.61–1.24)
GFR, Estimated: >60 mL/min (ref >60)
Glucose, Bld: 67 mg/dL — ABNORMAL LOW (ref 70–99)
Potassium: 4.7 mmol/L (ref 3.5–5.1)
Sodium: 136 mmol/L (ref 135–145)

## 2020-01-14 LAB — GLUCOSE, CAPILLARY
Glucose-Capillary: 152 mg/dL — ABNORMAL HIGH (ref 70–99)
Glucose-Capillary: 190 mg/dL — ABNORMAL HIGH (ref 70–99)
Glucose-Capillary: 279 mg/dL — ABNORMAL HIGH (ref 70–99)
Glucose-Capillary: 84 mg/dL (ref 70–99)
Glucose-Capillary: 90 mg/dL (ref 70–99)

## 2020-01-14 LAB — MAGNESIUM: Magnesium: 2.4 mg/dL (ref 1.7–2.4)

## 2020-01-14 NOTE — Plan of Care (Signed)
Patient weaned from HFNC 50L 50% to 8L HFNC, tolerating well.    Problem: Education: Goal: Knowledge of risk factors and measures for prevention of condition will improve Outcome: Progressing   Problem: Coping: Goal: Psychosocial and spiritual needs will be supported Outcome: Progressing   Problem: Respiratory: Goal: Will maintain a patent airway Outcome: Progressing Goal: Complications related to the disease process, condition or treatment will be avoided or minimized Outcome: Progressing

## 2020-01-14 NOTE — Progress Notes (Signed)
PROGRESS NOTE    Steven Hall  CZY:[id number] DOB: [birth year] DOA: 01/10/2020 PCP: Tonia Ghent, MD   Chief complaint: shortness of breath. Brief Narrative:  Steven Cleary Nanceis a 79 y.o.malewith medical history significant forBPH, hyperlipidemia on atorvastatin 10 mg daily presented to the emergency department at the urging of his son for worsening shortness of breath. He was diagnosed with COVID-19 on 12/31/2019. He states that he has not gotten his Covid vaccine.  Patient has severe respite failure, is placed on 100% heated high flow oxygen.  He is also placed on steroids, remdesivir and baricitinib.  He has a mild elevation of procalcitonin level at 0.4, start antibiotics with Zithromax and Rocephin.   Assessment & Plan:   Principal Problem:   Acute hypoxemic respiratory failure due to COVID-19 [facility]) Active Problems:   HLD (hyperlipidemia)   Hyperglycemia, drug-induced   Pneumonia due to COVID-19 virus   BPH (benign prostatic hyperplasia)   Traumatic periorbital ecchymosis of right eye   Elevated liver enzymes   Syncope and collapse   Acute hypoxemic respiratory failure  --on heated flow, with improved O2 requirement, 45% and 40L currently.  no respite distress. --Continue supplemental O2 to keep sats >=92%, wean as tolerated  COVID-19 pneumonia --started on solumedrol.  CRP 4.1 on 11/10 PLAN: --cont Remdesivir --cont oral decadron --cont Baricitinib --cont combivent QID  # Possible superimposed bacterial PNA --procal 0.4, started on Levofloxacin and doxy then ceftriaxone and azithromycin --cont azithromycin and ceftriaxone for 5 day course  # significant elevation of D-dimer --D-dimer has been >7500 since presentation. --CTA chest ruled out PE.  Empiric Eliquis d/c'ed.  syncope. Secondary to hypoxemia.  3.  Mild LFT elevation, resolved Secondary to Covid.    #4.  Hyperglycemia secondary to steroid use. # DM2 --A1c 7.2. --cont Levemir 10u  daily --SSI   DVT prophylaxis: Lovenox Code Status: full Family Communication:  Disposition Plan:   Status is: Inpatient  Remains inpatient appropriate because:Inpatient level of care appropriate due to severity of illness   Dispo: The patient is from: Home              Anticipated d/c is to: Home              Anticipated d/c date is: > 3 days              Patient currently is not medically stable to d/c.  On high O2 requirement with heated hf.     I/O last 3 completed shifts: In: 86 [P.O.:240; I.V.:12.7; IV Piggyback:577.3] Out: 1750 [Urine:1750] Total I/O In: -  Out: 150 [Urine:150]     Consultants:   none  Procedures: none  Antimicrobials:  Rocephin, zithromax  Subjective: Pt reported breathing better and wanted to go home.  Explained to pt he is still on heated hf with too high of oxygen requirement to leave the hospital.   Objective: Vitals:   01/14/20 0800 01/14/20 0830 01/14/20 1025 01/14/20 1124  BP: 106/69   123/77  Pulse: 75   83  Resp: 20 19  19   Temp: 97.8 F (36.6 C)   97.8 F (36.6 C)  TempSrc: Oral     SpO2: 96%  94% 97%  Weight:      Height:        Intake/Output Summary (Last 24 hours) at 01/14/2020 1521 Last data filed at 01/14/2020 1221 Gross per 24 hour  Intake 589.95 ml  Output 950 ml  Net -360.05 ml   [employer]  01/11/20 1100 01/12/20 0416 01/13/20 0521  Weight: 74.8 kg 68.1 kg 67.9 kg    Examination:  Constitutional: NAD, AAOx3 HEENT: conjunctivae and lids normal, EOMI, ecchymosis around the right eye CV: No cyanosis.   RESP: better air movement, on heated hf Extremities: No effusions, edema in BLE SKIN: warm, dry and intact Neuro: II - XII grossly intact.   Psych: depressed mood and affect.  Appropriate judgement and reason   Data Reviewed: I have personally reviewed following labs and imaging studies  CBC: Recent Labs  Lab 01/10/20 1332 01/11/20 0425 01/12/20 0503 01/13/20 0421 01/14/20 0409  WBC  4.8 5.0 7.9 10.9* 14.2*  NEUTROABS 3.8 4.0 6.8 9.7*  --   HGB 18.1* 15.8 15.1 15.3 15.7  HCT 53.0* 46.2 44.1 45.2 46.9  MCV 88.3 89.0 88.9 89.5 89.5  PLT 157 150 140* 143* 726   Basic Metabolic Panel: Recent Labs  Lab 01/10/20 1332 01/11/20 0425 01/12/20 0503 01/13/20 0421 01/14/20 0409  NA 136 135 135 134* 136  K 4.1 4.3 4.5 5.2* 4.7  CL 98 104 104 104 102  CO2 23 21* 22 24 26   GLUCOSE 143* 134* 237* 215* 67*  BUN 30* 35* 37* 34* 30*  CREATININE 1.20 1.04 1.07 0.87 1.02  CALCIUM 8.4* 8.1* 7.9* 7.9* 8.2*  MG 2.7*  --  2.5* 2.5* 2.4   GFR: Estimated Creatinine Clearance: 55.8 mL/min (by C-G formula based on SCr of 1.02 mg/dL). Liver Function Tests: Recent Labs  Lab 01/10/20 1332 01/11/20 0425 01/12/20 0503 01/13/20 0421  AST 74* 55* 42* 41  ALT 71* 55* 43 41  ALKPHOS 94 73 61 61  BILITOT 2.1* 1.2 0.9 0.8  PROT 7.8 6.6 5.8* 5.9*  ALBUMIN 2.9* 2.3* 2.1* 2.3*   No results for input(s): LIPASE, AMYLASE in the last 168 hours. No results for input(s): AMMONIA in the last 168 hours. Coagulation Profile: No results for input(s): INR, PROTIME in the last 168 hours. Cardiac Enzymes: No results for input(s): CKTOTAL, CKMB, CKMBINDEX, TROPONINI in the last 168 hours. BNP (last 3 results) No results for input(s): PROBNP in the last 8760 hours. HbA1C: Recent Labs    01/12/20 0503  HGBA1C 7.2*   CBG: Recent Labs  Lab 01/13/20 1226 01/13/20 1708 01/14/20 0023 01/14/20 0759 01/14/20 1121  GLUCAP 185* 268* 90 84 152*   Lipid Profile: No results for input(s): CHOL, HDL, LDLCALC, TRIG, CHOLHDL, LDLDIRECT in the last 72 hours. Thyroid Function Tests: No results for input(s): TSH, T4TOTAL, FREET4, T3FREE, THYROIDAB in the last 72 hours. Anemia Panel: No results for input(s): VITAMINB12, FOLATE, FERRITIN, TIBC, IRON, RETICCTPCT in the last 72 hours. Sepsis Labs: Recent Labs  Lab 01/10/20 1332 01/12/20 0503  PROCALCITON 0.40 0.20    Recent Results (from the past  240 hour(s))  Blood culture (routine x 2)     Status: None (Preliminary result)   Collection Time: 01/10/20  1:28 PM   Specimen: BLOOD  Result Value Ref Range Status   Specimen Description BLOOD RAC  Final   Special Requests   Final    BOTTLES DRAWN AEROBIC AND ANAEROBIC Blood Culture adequate volume   Culture   Final    NO GROWTH 4 DAYS Performed at [facility], [address] [place], [place] [address]    Report Status PENDING  Incomplete  Blood culture (routine x 2)     Status: None (Preliminary result)   Collection Time: 01/10/20  1:28 PM   Specimen: BLOOD  Result Value Ref  Range Status   Specimen Description BLOOD LAC  Final   Special Requests   Final    BOTTLES DRAWN AEROBIC AND ANAEROBIC Blood Culture adequate volume   Culture   Final    NO GROWTH 4 DAYS Performed at [facility], [address], [place], [place] [address]    Report Status PENDING  Incomplete         Radiology Studies: No results found.      Scheduled Meds: . atorvastatin  10 mg Oral QHS  . baricitinib  4 mg Oral Daily  . dexamethasone  6 mg Oral Daily  . enoxaparin (LOVENOX) injection  40 mg Subcutaneous Q24H  . finasteride  5 mg Oral Daily  . insulin aspart  0-9 Units Subcutaneous TID WC  . insulin detemir  10 Units Subcutaneous Daily  . Ipratropium-Albuterol  1 puff Inhalation QID  . tamsulosin  0.4 mg Oral Daily   Continuous Infusions: . sodium chloride Stopped (01/13/20 1819)  . azithromycin Stopped (01/13/20 1919)  . cefTRIAXone (ROCEPHIN)  IV 1 g (01/14/20 1216)     LOS: 3 days     Enzo Bi, MD [facility]   To contact the attending provider between 7A-7P or the covering provider during after hours 7P-7A, please log into the web site [email or web address] and access using universal  password for that web site. If you do not have the password, please call the hospital operator.  01/14/2020, 3:21 PM

## 2020-01-15 DIAGNOSIS — U071 COVID-19: Secondary | ICD-10-CM | POA: Diagnosis not present

## 2020-01-15 DIAGNOSIS — J9601 Acute respiratory failure with hypoxia: Secondary | ICD-10-CM | POA: Diagnosis not present

## 2020-01-15 LAB — MAGNESIUM: Magnesium: 2.4 mg/dL (ref 1.7–2.4)

## 2020-01-15 LAB — GLUCOSE, CAPILLARY
Glucose-Capillary: 109 mg/dL — ABNORMAL HIGH (ref 70–99)
Glucose-Capillary: 218 mg/dL — ABNORMAL HIGH (ref 70–99)
Glucose-Capillary: 231 mg/dL — ABNORMAL HIGH (ref 70–99)
Glucose-Capillary: 214 mg/dL — ABNORMAL HIGH (ref 70–99)

## 2020-01-15 LAB — CULTURE, BLOOD (ROUTINE X 2)
Culture: NO GROWTH
Culture: NO GROWTH
Special Requests: ADEQUATE
Special Requests: ADEQUATE

## 2020-01-15 LAB — C-REACTIVE PROTEIN: CRP: 5.5 mg/dL — ABNORMAL HIGH (ref <1.0)

## 2020-01-15 LAB — CBC
HCT: 46.1 % (ref 39.0–52.0)
Hemoglobin: 16.2 g/dL (ref 13.0–17.0)
MCH: 31.1 pg (ref 26.0–34.0)
MCHC: 35.1 g/dL (ref 30.0–36.0)
MCV: 88.5 fL (ref 80.0–100.0)
Platelets: 170 10*3/uL (ref 150–400)
RBC: 5.21 MIL/uL (ref 4.22–5.81)
RDW: 12.9 % (ref 11.5–15.5)
WBC: 11.7 10*3/uL — ABNORMAL HIGH (ref 4.0–10.5)
nRBC: 0 % (ref 0.0–0.2)

## 2020-01-15 LAB — BASIC METABOLIC PANEL
Anion gap: 8 (ref 5–15)
BUN: 24 mg/dL — ABNORMAL HIGH (ref 8–23)
CO2: 26 mmol/L (ref 22–32)
Calcium: 8.2 mg/dL — ABNORMAL LOW (ref 8.9–10.3)
Chloride: 102 mmol/L (ref 98–111)
Creatinine, Ser: 0.96 mg/dL (ref 0.61–1.24)
GFR, Estimated: >60 mL/min (ref >60)
Glucose, Bld: 103 mg/dL — ABNORMAL HIGH (ref 70–99)
Potassium: 4.5 mmol/L (ref 3.5–5.1)
Sodium: 136 mmol/L (ref 135–145)

## 2020-01-15 MED ORDER — INSULIN DETEMIR 100 UNIT/ML ~~LOC~~ SOLN
7.0000 [IU] | Freq: Every day | SUBCUTANEOUS | Status: DC
  Administered 2020-01-15 – 2020-01-16 (×2): 7 [IU] via SUBCUTANEOUS
  Filled 2020-01-15 (×3): qty 0.07

## 2020-01-15 MED ORDER — INSULIN ASPART 100 UNIT/ML ~~LOC~~ SOLN
5.0000 [IU] | Freq: Three times a day (TID) | SUBCUTANEOUS | Status: DC
  Administered 2020-01-15 (×3): 5 [IU] via SUBCUTANEOUS
  Filled 2020-01-15 (×2): qty 1

## 2020-01-15 NOTE — Progress Notes (Signed)
PROGRESS NOTE    Steven Hall  YOV:[id number] DOB: 11/22/1940 DOA: 01/10/2020 PCP: Tonia Ghent, MD   Chief complaint: shortness of breath. Brief Narrative:  Steven Hogston Nanceis a 79 y.o.malewith medical history significant forBPH, hyperlipidemia on atorvastatin 10 mg daily presented to the emergency department at the urging of his son for worsening shortness of breath. He was diagnosed with COVID-19 on 12/31/2019. He states that he has not gotten his Covid vaccine.  Patient has severe respite failure, is placed on 100% heated high flow oxygen.  He is also placed on steroids, remdesivir and baricitinib.  He has a mild elevation of procalcitonin level at 0.4, start antibiotics with Zithromax and Rocephin.   Assessment & Plan:   Principal Problem:   Acute hypoxemic respiratory failure due to COVID-19 [facility]) Active Problems:   HLD (hyperlipidemia)   Hyperglycemia, drug-induced   Pneumonia due to COVID-19 virus   BPH (benign prostatic hyperplasia)   Traumatic periorbital ecchymosis of right eye   Elevated liver enzymes   Syncope and collapse   Acute hypoxemic respiratory failure  --on heated flow, with improved O2 requirement, 45% and 40L currently.  no respite distress. --weaned down to 5L today PLAN: --Continue supplemental O2 to keep sats >=92%, wean as tolerated --can discharge if down to 2L  COVID-19 pneumonia --started on solumedrol.  CRP 4.1 on 11/10 --completed Remdesivir PLAN: --cont oral decadron, trend CRP --cont Baricitinib --cont combivent QID  # Possible superimposed bacterial PNA --procal 0.4, started on Levofloxacin and doxy then ceftriaxone and azithromycin, finished 5 days of abx.  # significant elevation of D-dimer --D-dimer has been >7500 since presentation. --CTA chest ruled out PE.  Empiric Eliquis d/c'ed.  syncope. Secondary to hypoxemia.  3.  Mild LFT elevation, resolved Secondary to Covid.    #4.  Hyperglycemia secondary to steroid  use. # DM2 --A1c 7.2. --decrease Levemir to 7u daily --mealtime 5u TID --SSI  # BPH --cont home finasteride and Flomax   DVT prophylaxis: Lovenox Code Status: full Family Communication:  Disposition Plan:   Status is: Inpatient  Remains inpatient appropriate because:Inpatient level of care appropriate due to severity of illness   Dispo: The patient is from: Home              Anticipated d/c is to: Home              Anticipated d/c date is: 2-3 days               Patient currently is not medically stable to d/c.  Still on 5L O2.     I/O last 3 completed shifts: In: 572.6 [P.O.:360; IV Piggyback:212.6] Out: 7412 [Urine:1725] Total I/O In: -  Out: 225 [Urine:225]     Consultants:   none  Procedures: none  Antimicrobials:  Rocephin, zithromax  Subjective: O2 requirement improved significantly during the past day, down to 5L.  Pt is eager to go home.   Objective: Vitals:   01/15/20 0830 01/15/20 0857 01/15/20 0913 01/15/20 1124  BP:  111/74  110/72  Pulse:  76  88  Resp: (!) 25 19 16 19   Temp:  (!) 97.5 F (36.4 C)  (!) 97.5 F (36.4 C)  TempSrc:  Oral  Oral  SpO2:  96% 95% 93%  Weight:      Height:        Intake/Output Summary (Last 24 hours) at 01/15/2020 1406 Last data filed at 01/15/2020 1124 Gross per 24 hour  Intake 360 ml  Output  1250 ml  Net -890 ml   Filed Weights   01/12/20 0416 01/13/20 0521 01/15/20 0506  Weight: 68.1 kg 67.9 kg 65 kg    Examination:  Constitutional: NAD, AAOx3 HEENT: conjunctivae and lids normal, EOMI, ecchymosis around right eye CV: No cyanosis.   RESP: normal respiratory effort, on 5L Extremities: No effusions, edema in BLE SKIN: warm, dry and intact Neuro: II - XII grossly intact.   Psych: Normal mood and affect.  Appropriate judgement and reason    Data Reviewed: I have personally reviewed following labs and imaging studies  CBC: Recent Labs  Lab 01/10/20 1332 01/10/20 1332 01/11/20 0425  01/12/20 0503 01/13/20 0421 01/14/20 0409 01/15/20 0644  WBC 4.8   < > 5.0 7.9 10.9* 14.2* 11.7*  NEUTROABS 3.8  --  4.0 6.8 9.7*  --   --   HGB 18.1*   < > 15.8 15.1 15.3 15.7 16.2  HCT 53.0*   < > 46.2 44.1 45.2 46.9 46.1  MCV 88.3   < > 89.0 88.9 89.5 89.5 88.5  PLT 157   < > 150 140* 143* 159 170   < > = values in this interval not displayed.   Basic Metabolic Panel: Recent Labs  Lab 01/10/20 1332 01/10/20 1332 01/11/20 0425 01/12/20 0503 01/13/20 0421 01/14/20 0409 01/15/20 0644  NA 136   < > 135 135 134* 136 136  K 4.1   < > 4.3 4.5 5.2* 4.7 4.5  CL 98   < > 104 104 104 102 102  CO2 23   < > 21* 22 24 26 26   GLUCOSE 143*   < > 134* 237* 215* 67* 103*  BUN 30*   < > 35* 37* 34* 30* 24*  CREATININE 1.20   < > 1.04 1.07 0.87 1.02 0.96  CALCIUM 8.4*   < > 8.1* 7.9* 7.9* 8.2* 8.2*  MG 2.7*  --   --  2.5* 2.5* 2.4 2.4   < > = values in this interval not displayed.   GFR: Estimated Creatinine Clearance: 58.3 mL/min (by C-G formula based on SCr of 0.96 mg/dL). Liver Function Tests: Recent Labs  Lab 01/10/20 1332 01/11/20 0425 01/12/20 0503 01/13/20 0421  AST 74* 55* 42* 41  ALT 71* 55* 43 41  ALKPHOS 94 73 61 61  BILITOT 2.1* 1.2 0.9 0.8  PROT 7.8 6.6 5.8* 5.9*  ALBUMIN 2.9* 2.3* 2.1* 2.3*   No results for input(s): LIPASE, AMYLASE in the last 168 hours. No results for input(s): AMMONIA in the last 168 hours. Coagulation Profile: No results for input(s): INR, PROTIME in the last 168 hours. Cardiac Enzymes: No results for input(s): CKTOTAL, CKMB, CKMBINDEX, TROPONINI in the last 168 hours. BNP (last 3 results) No results for input(s): PROBNP in the last 8760 hours. HbA1C: No results for input(s): HGBA1C in the last 72 hours. CBG: Recent Labs  Lab 01/14/20 1121 01/14/20 1730 01/14/20 2035 01/15/20 0853 01/15/20 1121  GLUCAP 152* 279* 190* 109* 218*   Lipid Profile: No results for input(s): CHOL, HDL, LDLCALC, TRIG, CHOLHDL, LDLDIRECT in the last 72  hours. Thyroid Function Tests: No results for input(s): TSH, T4TOTAL, FREET4, T3FREE, THYROIDAB in the last 72 hours. Anemia Panel: No results for input(s): VITAMINB12, FOLATE, FERRITIN, TIBC, IRON, RETICCTPCT in the last 72 hours. Sepsis Labs: Recent Labs  Lab 01/10/20 1332 01/12/20 0503  PROCALCITON 0.40 0.20    Recent Results (from the past 240 hour(s))  Blood culture (routine x 2)  Status: None   Collection Time: 01/10/20  1:28 PM   Specimen: BLOOD  Result Value Ref Range Status   Specimen Description BLOOD RAC  Final   Special Requests   Final    BOTTLES DRAWN AEROBIC AND ANAEROBIC Blood Culture adequate volume   Culture   Final    NO GROWTH 5 DAYS Performed at [facility], [address], [place], [place] [address]    Report Status 01/15/2020 FINAL  Final  Blood culture (routine x 2)     Status: None   Collection Time: 01/10/20  1:28 PM   Specimen: BLOOD  Result Value Ref Range Status   Specimen Description BLOOD LAC  Final   Special Requests   Final    BOTTLES DRAWN AEROBIC AND ANAEROBIC Blood Culture adequate volume   Culture   Final    NO GROWTH 5 DAYS Performed at [facility], [address], [place], [place] [address]    Report Status 01/15/2020 FINAL  Final         Radiology Studies: No results found.      Scheduled Meds: . atorvastatin  10 mg Oral QHS  . baricitinib  4 mg Oral Daily  . dexamethasone  6 mg Oral Daily  . enoxaparin (LOVENOX) injection  40 mg Subcutaneous Q24H  . finasteride  5 mg Oral Daily  . insulin aspart  0-9 Units Subcutaneous TID WC  . insulin aspart  5 Units Subcutaneous TID WC  . insulin detemir  7 Units Subcutaneous Daily  . Ipratropium-Albuterol  1 puff Inhalation QID  . tamsulosin  0.4 mg Oral Daily   Continuous Infusions: . sodium chloride Stopped (01/13/20 1819)     LOS: 4 days     Enzo Bi, MD [facility]   To contact the attending provider between 7A-7P or the covering  provider during after hours 7P-7A, please log into the web site [email or web address] and access using universal [place] password for that web site. If you do not have the password, please call the hospital operator.  01/15/2020, 2:06 PM

## 2020-01-16 DIAGNOSIS — J9601 Acute respiratory failure with hypoxia: Secondary | ICD-10-CM | POA: Diagnosis not present

## 2020-01-16 DIAGNOSIS — U071 COVID-19: Secondary | ICD-10-CM | POA: Diagnosis not present

## 2020-01-16 LAB — CBC
HCT: 47.4 % (ref 39.0–52.0)
Hemoglobin: 16.6 g/dL (ref 13.0–17.0)
MCH: 30.6 pg (ref 26.0–34.0)
MCHC: 35 g/dL (ref 30.0–36.0)
MCV: 87.5 fL (ref 80.0–100.0)
Platelets: 242 10*3/uL (ref 150–400)
RBC: 5.42 MIL/uL (ref 4.22–5.81)
RDW: 13.1 % (ref 11.5–15.5)
WBC: 12.8 10*3/uL — ABNORMAL HIGH (ref 4.0–10.5)
nRBC: 0 % (ref 0.0–0.2)

## 2020-01-16 LAB — BASIC METABOLIC PANEL
Anion gap: 9 (ref 5–15)
BUN: 27 mg/dL — ABNORMAL HIGH (ref 8–23)
CO2: 26 mmol/L (ref 22–32)
Calcium: 8.8 mg/dL — ABNORMAL LOW (ref 8.9–10.3)
Chloride: 96 mmol/L — ABNORMAL LOW (ref 98–111)
Creatinine, Ser: 1.04 mg/dL (ref 0.61–1.24)
GFR, Estimated: >60 mL/min (ref >60)
Glucose, Bld: 120 mg/dL — ABNORMAL HIGH (ref 70–99)
Potassium: 4.5 mmol/L (ref 3.5–5.1)
Sodium: 131 mmol/L — ABNORMAL LOW (ref 135–145)

## 2020-01-16 LAB — GLUCOSE, CAPILLARY
Glucose-Capillary: 131 mg/dL — ABNORMAL HIGH (ref 70–99)
Glucose-Capillary: 242 mg/dL — ABNORMAL HIGH (ref 70–99)
Glucose-Capillary: 244 mg/dL — ABNORMAL HIGH (ref 70–99)
Glucose-Capillary: 158 mg/dL — ABNORMAL HIGH (ref 70–99)

## 2020-01-16 LAB — C-REACTIVE PROTEIN: CRP: 3.3 mg/dL — ABNORMAL HIGH (ref <1.0)

## 2020-01-16 LAB — MAGNESIUM: Magnesium: 2.2 mg/dL (ref 1.7–2.4)

## 2020-01-16 MED ORDER — INSULIN ASPART 100 UNIT/ML ~~LOC~~ SOLN
10.0000 [IU] | Freq: Three times a day (TID) | SUBCUTANEOUS | Status: DC
  Administered 2020-01-16 (×2): 10 [IU] via SUBCUTANEOUS
  Filled 2020-01-16 (×2): qty 1

## 2020-01-16 NOTE — Progress Notes (Signed)
PROGRESS NOTE    Steven Hall  GQQ:[id number] DOB: May 31, 1940 DOA: 01/10/2020 PCP: Tonia Ghent, MD   Chief complaint: shortness of breath. Brief Narrative:  Steven Grieco Nanceis a 79 y.o.malewith medical history significant forBPH, hyperlipidemia on atorvastatin 10 mg daily presented to the emergency department at the urging of his son for worsening shortness of breath. He was diagnosed with COVID-19 on 12/31/2019. He states that he has not gotten his Covid vaccine.  Patient has severe respite failure, is placed on 100% heated high flow oxygen.  He is also placed on steroids, remdesivir and baricitinib.  He has a mild elevation of procalcitonin level at 0.4, start antibiotics with Zithromax and Rocephin.   Assessment & Plan:   Principal Problem:   Acute hypoxemic respiratory failure due to COVID-19 [facility]) Active Problems:   HLD (hyperlipidemia)   Hyperglycemia, drug-induced   Pneumonia due to COVID-19 virus   BPH (benign prostatic hyperplasia)   Traumatic periorbital ecchymosis of right eye   Elevated liver enzymes   Syncope and collapse   Acute hypoxemic respiratory failure  --on heated flow, with improved O2 requirement, 45% and 40L currently.  no respite distress. --weaned down to 4L at rest today, however, O2 sat dropped to 84% on 4L while walking. PLAN: --Continue supplemental O2 to keep sats >=92%, wean as tolerated --can discharge if 4L or less with walking  COVID-19 pneumonia --started on solumedrol.  CRP 4.1 on 11/10, trending down --completed Remdesivir PLAN: --cont oral decadron --cont Baricitinib --cont combivent QID  # Possible superimposed bacterial PNA --procal 0.4, started on Levofloxacin and doxy then ceftriaxone and azithromycin, finished 5 days of abx.  # significant elevation of D-dimer --D-dimer has been >7500 since presentation. --CTA chest ruled out PE.  Empiric Eliquis d/c'ed.  syncope. Secondary to hypoxemia.  3.  Mild LFT  elevation, resolved Secondary to Covid.    #4.  Hyperglycemia secondary to steroid use. # DM2 --A1c 7.2. --cont Levemir 7u daily --increase mealtime to 10u TID --SSI  # BPH --cont home finasteride and Flomax  # HLD --cont home statin   DVT prophylaxis: Lovenox Code Status: full Family Communication:  Disposition Plan:   Status is: Inpatient  Remains inpatient appropriate because:Inpatient level of care appropriate due to severity of illness   Dispo: The patient is from: Home              Anticipated d/c is to: Home              Anticipated d/c date is: 2-3 days               Patient currently is not medically stable to d/c.  Still need >4L O2 with walking.     I/O last 3 completed shifts: In: 360 [P.O.:360] Out: 1425 [Urine:1425] No intake/output data recorded.     Consultants:   none  Procedures: none  Antimicrobials:  Rocephin, zithromax  Subjective: Pt reported feeling good, and no dyspnea, however, desat down to 84% on 4L while walking.     Objective: Vitals:   01/16/20 0400 01/16/20 0600 01/16/20 0900 01/16/20 1248  BP: 112/82  118/75 103/75  Pulse: 87  94 88  Resp:   19 19  Temp: (!) 97.5 F (36.4 C)  97.9 F (36.6 C) 98 F (36.7 C)  TempSrc: Oral  Oral Oral  SpO2: 93%  93% 94%  Weight:  66.2 kg    Height:        Intake/Output Summary (Last 24 hours)  at 01/16/2020 1314 Last data filed at 01/16/2020 0300 Gross per 24 hour  Intake --  Output 700 ml  Net -700 ml   Filed Weights   01/13/20 0521 01/15/20 0506 01/16/20 0600  Weight: 67.9 kg 65 kg 66.2 kg    Examination:  Constitutional: NAD, AAOx3 HEENT: conjunctivae and lids normal, EOMI, ecchymosis around right eye improved CV: No cyanosis.   RESP: clear, on 4L at rest Extremities: No effusions, edema in BLE SKIN: warm, dry and intact Neuro: II - XII grossly intact.   Psych: Normal mood and affect.     Data Reviewed: I have personally reviewed following labs and imaging  studies  CBC: Recent Labs  Lab 01/10/20 1332 01/10/20 1332 01/11/20 0425 01/11/20 0425 01/12/20 0503 01/13/20 0421 01/14/20 0409 01/15/20 0644 01/16/20 0641  WBC 4.8   < > 5.0   < > 7.9 10.9* 14.2* 11.7* 12.8*  NEUTROABS 3.8  --  4.0  --  6.8 9.7*  --   --   --   HGB 18.1*   < > 15.8   < > 15.1 15.3 15.7 16.2 16.6  HCT 53.0*   < > 46.2   < > 44.1 45.2 46.9 46.1 47.4  MCV 88.3   < > 89.0   < > 88.9 89.5 89.5 88.5 87.5  PLT 157   < > 150   < > 140* 143* 159 170 242   < > = values in this interval not displayed.   Basic Metabolic Panel: Recent Labs  Lab 01/12/20 0503 01/13/20 0421 01/14/20 0409 01/15/20 0644 01/16/20 0641  NA 135 134* 136 136 131*  K 4.5 5.2* 4.7 4.5 4.5  CL 104 104 102 102 96*  CO2 22 24 26 26 26   GLUCOSE 237* 215* 67* 103* 120*  BUN 37* 34* 30* 24* 27*  CREATININE 1.07 0.87 1.02 0.96 1.04  CALCIUM 7.9* 7.9* 8.2* 8.2* 8.8*  MG 2.5* 2.5* 2.4 2.4 2.2   GFR: Estimated Creatinine Clearance: 54.7 mL/min (by C-G formula based on SCr of 1.04 mg/dL). Liver Function Tests: Recent Labs  Lab 01/10/20 1332 01/11/20 0425 01/12/20 0503 01/13/20 0421  AST 74* 55* 42* 41  ALT 71* 55* 43 41  ALKPHOS 94 73 61 61  BILITOT 2.1* 1.2 0.9 0.8  PROT 7.8 6.6 5.8* 5.9*  ALBUMIN 2.9* 2.3* 2.1* 2.3*   No results for input(s): LIPASE, AMYLASE in the last 168 hours. No results for input(s): AMMONIA in the last 168 hours. Coagulation Profile: No results for input(s): INR, PROTIME in the last 168 hours. Cardiac Enzymes: No results for input(s): CKTOTAL, CKMB, CKMBINDEX, TROPONINI in the last 168 hours. BNP (last 3 results) No results for input(s): PROBNP in the last 8760 hours. HbA1C: No results for input(s): HGBA1C in the last 72 hours. CBG: Recent Labs  Lab 01/15/20 1121 01/15/20 1750 01/15/20 2036 01/16/20 0912 01/16/20 1253  GLUCAP 218* 231* 214* 131* 242*   Lipid Profile: No results for input(s): CHOL, HDL, LDLCALC, TRIG, CHOLHDL, LDLDIRECT in the last  72 hours. Thyroid Function Tests: No results for input(s): TSH, T4TOTAL, FREET4, T3FREE, THYROIDAB in the last 72 hours. Anemia Panel: No results for input(s): VITAMINB12, FOLATE, FERRITIN, TIBC, IRON, RETICCTPCT in the last 72 hours. Sepsis Labs: Recent Labs  Lab 01/10/20 1332 01/12/20 0503  PROCALCITON 0.40 0.20    Recent Results (from the past 240 hour(s))  Blood culture (routine x 2)     Status: None   Collection Time: 01/10/20  1:28  PM   Specimen: BLOOD  Result Value Ref Range Status   Specimen Description BLOOD RAC  Final   Special Requests   Final    BOTTLES DRAWN AEROBIC AND ANAEROBIC Blood Culture adequate volume   Culture   Final    NO GROWTH 5 DAYS Performed at [facility], [address] [place], [place] [address]    Report Status 01/15/2020 FINAL  Final  Blood culture (routine x 2)     Status: None   Collection Time: 01/10/20  1:28 PM   Specimen: BLOOD  Result Value Ref Range Status   Specimen Description BLOOD LAC  Final   Special Requests   Final    BOTTLES DRAWN AEROBIC AND ANAEROBIC Blood Culture adequate volume   Culture   Final    NO GROWTH 5 DAYS Performed at [facility], [address], [place],  [address]    Report Status 01/15/2020 FINAL  Final         Radiology Studies: No results found.      Scheduled Meds:  atorvastatin  10 mg Oral QHS   baricitinib  4 mg Oral Daily   dexamethasone  6 mg Oral Daily   enoxaparin (LOVENOX) injection  40 mg Subcutaneous Q24H   finasteride  5 mg Oral Daily   insulin aspart  0-9 Units Subcutaneous TID WC   insulin aspart  10 Units Subcutaneous TID WC   insulin detemir  7 Units Subcutaneous Daily   Ipratropium-Albuterol  1 puff Inhalation QID   tamsulosin  0.4 mg Oral Daily   Continuous Infusions:  sodium chloride Stopped (01/13/20 1819)     LOS: 5 days     Enzo Bi, MD [facility]   To contact the attending provider between 7A-7P or the  covering provider during after hours 7P-7A, please log into the web site [email or web address] and access using universal [place] password for that web site. If you do not have the password, please call the hospital operator.  01/16/2020, 1:14 PM

## 2020-01-16 NOTE — Evaluation (Signed)
Physical Therapy Evaluation Patient Details Name: Steven Hall MRN: [id number] DOB: 26-Aug-1940 Today's Date: 01/16/2020   History of Present Illness  Steven Hall is a 24yoM who comes to [facility] on 11/7 for SOB, progressive weakness. He was diagnosed with COVID on 12/31/19. Prior to going to ED patietn was sitting on couch when he bent over and lost conciousness hitting his head ont he coffee table.  PMH of BPH, vertigo, HLD on atorvastatin. Patient used to work for [employer], lives with wife.  Clinical Impression  Pt admitted with above diagnosis. Pt currently with functional limitations due to the deficits listed below (see "PT Problem List"). Upon entry, pt in bed finishing lunch, awake and agreeable to participate. The pt is alert and oriented x4, pleasant, conversational, and generally a good historian. Pt on 4L O2 throughout session. ModI bed mobility, Supervision for transfers, MinGuardA for unsupported AMB due to mild unsteadiness and frequent LOB, no assist needed for righting. AMB 19ft on 4L results in desaturation to 84% which recovers after 90sec resting at EOB. Functional mobility assessment demonstrates increased effort/time requirements, fair tolerance, but no need for physical assistance, whereas the patient performed these at a higher level of independence PTA. Pt will benefit from skilled PT intervention to increase independence and safety with basic mobility in preparation for discharge to the venue listed below.       Follow Up Recommendations Home health PT;Supervision - Intermittent;Other (comment) (will need less supplemental O2 prior to being at home)    Equipment Recommendations  Rolling walker with 5" wheels    Recommendations for Other Services       Precautions / Restrictions Precautions Precautions: Fall      Mobility  Bed Mobility Overal bed mobility: Modified Independent                  Transfers Overall transfer level: Needs  assistance Equipment used: None Transfers: Sit to/from Stand;Stand Pivot Transfers Sit to Stand: Supervision Stand pivot transfers: Supervision       General transfer comment: ad lib use of hands for support and safety  Ambulation/Gait Ambulation/Gait assistance: Min guard Gait Distance (Feet): 40 Feet Assistive device: None Gait Pattern/deviations: Drifts right/left     General Gait Details: alternating FWB, retro at bedside, 1ft, 4LO2, desat to 84% with slow recovery to 90% over 90sec.  Stairs            Wheelchair Mobility    Modified Rankin (Stroke Patients Only)       Balance Overall balance assessment: Mild deficits observed, not formally tested;Modified Independent (will need device for balance AMB)                                           Pertinent Vitals/Pain Pain Assessment: No/denies pain    Home Living Family/patient expects to be discharged to:: Private residence Living Arrangements: Spouse/significant other Available Help at Discharge: Family Type of Home: House Home Access: Stairs to enter   Technical brewer of Steps: 1   Home Equipment: None      Prior Function Level of Independence: Independent         Comments: Wife has dementia, DTR has been looking after her since pt has been admitted; Wife uses 4WW     Hand Dominance        Extremity/Trunk Assessment   Upper Extremity Assessment Upper Extremity Assessment:  Overall [facility] for tasks assessed    Lower Extremity Assessment Lower Extremity Assessment: Overall WFL for tasks assessed    Cervical / Trunk Assessment Cervical / Trunk Assessment: Normal  Communication      Cognition Arousal/Alertness: Awake/alert Behavior During Therapy: WFL for tasks assessed/performed Overall Cognitive Status: Within Functional Limits for tasks assessed                                        General Comments      Exercises     Assessment/Plan     PT Assessment Patient needs continued PT services  PT Problem List Decreased strength;Decreased activity tolerance;Decreased balance;Decreased mobility;Decreased knowledge of precautions;Cardiopulmonary status limiting activity       PT Treatment Interventions DME instruction;Gait training;Stair training;Functional mobility training;Therapeutic activities;Therapeutic exercise;Patient/family education    PT Goals (Current goals can be found in the Care Plan section)  Acute Rehab PT Goals Patient Stated Goal: return to baseline mobility PT Goal Formulation: With patient Time For Goal Achievement: 01/30/20 Potential to Achieve Goals: Good    Frequency Min 2X/week   Barriers to discharge Decreased caregiver support 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 : A Little 6 Click Score: 20    End of Session Equipment Utilized During Treatment: Oxygen Activity Tolerance: Patient tolerated treatment well;Treatment limited secondary to medical complications (Comment) Patient left: in chair;with call bell/phone within reach;with family/visitor present Nurse Communication: Mobility status (O2 sensor not working) PT Visit Diagnosis: Unsteadiness on feet (R26.81);Difficulty in walking, not elsewhere classified (R26.2);Other abnormalities of gait and mobility (R26.89)    Time: 0258-5277 PT Time Calculation (min) (ACUTE ONLY): 18  min   Charges:   PT Evaluation $PT Eval Low Complexity: 1 Low          7:19 PM, 01/16/20 Steven Hall, PT, DPT Physical Therapist - [facility]  [phone] ([place])    Steven Hall 01/16/2020, 7:16 PM

## 2020-01-16 NOTE — Plan of Care (Signed)
°  Problem: Education: Goal: Knowledge of General Education information will improve Description: Including pain rating scale, medication(s)/side effects and non-pharmacologic comfort measures 01/16/2020 1213 by Cristela Blue, RN Outcome: Progressing 01/16/2020 1213 by Cristela Blue, RN Outcome: Progressing   Problem: Health Behavior/Discharge Planning: Goal: Ability to manage health-related needs will improve 01/16/2020 1213 by Cristela Blue, RN Outcome: Progressing 01/16/2020 1213 by Cristela Blue, RN Outcome: Progressing   Problem: Clinical Measurements: Goal: Ability to maintain clinical measurements within normal limits will improve 01/16/2020 1213 by Cristela Blue, RN Outcome: Progressing 01/16/2020 1213 by Cristela Blue, RN Outcome: Progressing Goal: Will remain free from infection 01/16/2020 1213 by Cristela Blue, RN Outcome: Progressing 01/16/2020 1213 by Cristela Blue, RN Outcome: Progressing Goal: Diagnostic test results will improve 01/16/2020 1213 by Cristela Blue, RN Outcome: Progressing 01/16/2020 1213 by Cristela Blue, RN Outcome: Progressing Goal: Respiratory complications will improve 01/16/2020 1213 by Cristela Blue, RN Outcome: Progressing 01/16/2020 1213 by Cristela Blue, RN Outcome: Progressing Goal: Cardiovascular complication will be avoided 01/16/2020 1213 by Cristela Blue, RN Outcome: Progressing 01/16/2020 1213 by Cristela Blue, RN Outcome: Progressing   Problem: Activity: Goal: Risk for activity intolerance will decrease 01/16/2020 1213 by Cristela Blue, RN Outcome: Progressing 01/16/2020 1213 by Cristela Blue, RN Outcome: Progressing   Problem: Nutrition: Goal: Adequate nutrition will be maintained 01/16/2020 1213 by Cristela Blue, RN Outcome: Progressing 01/16/2020 1213 by Cristela Blue, RN Outcome: Progressing   Problem: Coping: Goal: Level of anxiety will decrease 01/16/2020 1213 by Cristela Blue, RN Outcome:  Progressing 01/16/2020 1213 by Cristela Blue, RN Outcome: Progressing   Problem: Elimination: Goal: Will not experience complications related to bowel motility 01/16/2020 1213 by Cristela Blue, RN Outcome: Progressing 01/16/2020 1213 by Cristela Blue, RN Outcome: Progressing Goal: Will not experience complications related to urinary retention 01/16/2020 1213 by Cristela Blue, RN Outcome: Progressing 01/16/2020 1213 by Cristela Blue, RN Outcome: Progressing   Problem: Pain Managment: Goal: General experience of comfort will improve 01/16/2020 1213 by Cristela Blue, RN Outcome: Progressing 01/16/2020 1213 by Cristela Blue, RN Outcome: Progressing   Problem: Safety: Goal: Ability to remain free from injury will improve 01/16/2020 1213 by Cristela Blue, RN Outcome: Progressing 01/16/2020 1213 by Cristela Blue, RN Outcome: Progressing   Problem: Skin Integrity: Goal: Risk for impaired skin integrity will decrease 01/16/2020 1213 by Cristela Blue, RN Outcome: Progressing 01/16/2020 1213 by Cristela Blue, RN Outcome: Progressing   Problem: Education: Goal: Knowledge of risk factors and measures for prevention of condition will improve 01/16/2020 1213 by Cristela Blue, RN Outcome: Progressing 01/16/2020 1213 by Cristela Blue, RN Outcome: Progressing   Problem: Coping: Goal: Psychosocial and spiritual needs will be supported 01/16/2020 1213 by Cristela Blue, RN Outcome: Progressing 01/16/2020 1213 by Cristela Blue, RN Outcome: Progressing   Problem: Respiratory: Goal: Will maintain a patent airway 01/16/2020 1213 by Cristela Blue, RN Outcome: Progressing 01/16/2020 1213 by Cristela Blue, RN Outcome: Progressing Goal: Complications related to the disease process, condition or treatment will be avoided or minimized 01/16/2020 1213 by Cristela Blue, RN Outcome: Progressing 01/16/2020 1213 by Cristela Blue, RN Outcome: Progressing

## 2020-01-17 DIAGNOSIS — U071 COVID-19: Secondary | ICD-10-CM | POA: Diagnosis not present

## 2020-01-17 DIAGNOSIS — J9601 Acute respiratory failure with hypoxia: Secondary | ICD-10-CM | POA: Diagnosis not present

## 2020-01-17 LAB — CBC
HCT: 50.4 % (ref 39.0–52.0)
Hemoglobin: 17.2 g/dL — ABNORMAL HIGH (ref 13.0–17.0)
MCH: 30 pg (ref 26.0–34.0)
MCHC: 34.1 g/dL (ref 30.0–36.0)
MCV: 87.8 fL (ref 80.0–100.0)
Platelets: 299 10*3/uL (ref 150–400)
RBC: 5.74 MIL/uL (ref 4.22–5.81)
RDW: 13.2 % (ref 11.5–15.5)
WBC: 14.7 10*3/uL — ABNORMAL HIGH (ref 4.0–10.5)
nRBC: 0 % (ref 0.0–0.2)

## 2020-01-17 LAB — MAGNESIUM: Magnesium: 2.3 mg/dL (ref 1.7–2.4)

## 2020-01-17 LAB — BASIC METABOLIC PANEL
Anion gap: 9 (ref 5–15)
BUN: 30 mg/dL — ABNORMAL HIGH (ref 8–23)
CO2: 30 mmol/L (ref 22–32)
Calcium: 8.8 mg/dL — ABNORMAL LOW (ref 8.9–10.3)
Chloride: 96 mmol/L — ABNORMAL LOW (ref 98–111)
Creatinine, Ser: 1.12 mg/dL (ref 0.61–1.24)
GFR, Estimated: >60 mL/min (ref >60)
Glucose, Bld: 114 mg/dL — ABNORMAL HIGH (ref 70–99)
Potassium: 4.5 mmol/L (ref 3.5–5.1)
Sodium: 135 mmol/L (ref 135–145)

## 2020-01-17 LAB — GLUCOSE, CAPILLARY
Glucose-Capillary: 108 mg/dL — ABNORMAL HIGH (ref 70–99)
Glucose-Capillary: 61 mg/dL — ABNORMAL LOW (ref 70–99)
Glucose-Capillary: 243 mg/dL — ABNORMAL HIGH (ref 70–99)
Glucose-Capillary: 300 mg/dL — ABNORMAL HIGH (ref 70–99)

## 2020-01-17 MED ORDER — INSULIN ASPART 100 UNIT/ML ~~LOC~~ SOLN: 5.0000 [IU] | Freq: Three times a day (TID) | SUBCUTANEOUS | Status: DC

## 2020-01-17 MED ORDER — ACETAMINOPHEN 500 MG PO TABS
1000.0000 mg | ORAL_TABLET | Freq: Three times a day (TID) | ORAL | Status: DC | PRN
  Administered 2020-01-17: 21:00:00 1000 mg via ORAL
  Filled 2020-01-17: qty 2

## 2020-01-17 NOTE — Progress Notes (Signed)
Came in to ambulate patient and assess oxygenation at same time. Patient begin coughing when he sat on side of bed. Patient refused to ambulate, states that he "was not ready." Notified MD

## 2020-01-17 NOTE — Progress Notes (Signed)
PROGRESS NOTE    Steven Hall  KZS:[id number] DOB: 1940-08-18 DOA: 01/10/2020 PCP: Tonia Ghent, MD   Chief complaint: shortness of breath. Brief Narrative:  Steven Hall a 79 y.o.malewith medical history significant forBPH, hyperlipidemia on atorvastatin 10 mg daily presented to the emergency department at the urging of his son for worsening shortness of breath. He was diagnosed with COVID-19 on 12/31/2019. He states that he has not gotten his Covid vaccine.  Patient has severe respite failure, is placed on 100% heated high flow oxygen.  He is also placed on steroids, remdesivir and baricitinib.  He has a mild elevation of procalcitonin level at 0.4, start antibiotics with Zithromax and Rocephin.   Assessment & Plan:   Principal Problem:   Acute hypoxemic respiratory failure due to COVID-19 [facility]) Active Problems:   HLD (hyperlipidemia)   Hyperglycemia, drug-induced   Pneumonia due to COVID-19 virus   BPH (benign prostatic hyperplasia)   Traumatic periorbital ecchymosis of right eye   Elevated liver enzymes   Syncope and collapse   Acute hypoxemic respiratory failure  --on heated flow, with improved O2 requirement, 45% and 40L currently.  no respite distress. --weaned down to 4L at rest today, however, O2 sat dropped to 84% on 4L while walking. PLAN: --Continue supplemental O2 to keep sats >=92%, wean as tolerated --will discharge on 4L O2   COVID-19 pneumonia --started on solumedrol.  CRP 4.1 on 11/10, trending down --completed Remdesivir PLAN: --cont oral decadron --cont Baricitinib --cont combivent QID  # Possible superimposed bacterial PNA --procal 0.4, started on Levofloxacin and doxy then ceftriaxone and azithromycin, finished 5 days of abx.  # significant elevation of D-dimer --D-dimer has been >7500 since presentation. --CTA chest ruled out PE.  Empiric Eliquis d/c'ed.  syncope. Secondary to hypoxemia.  3.  Mild LFT elevation,  resolved Secondary to Covid.    #4.  Hyperglycemia secondary to steroid use. # DM2 --A1c 7.2. --BG trending down  PLAN: --d/c Levemir  --SSI  # BPH --cont home finasteride and flomax  # HLD --cont home statin   DVT prophylaxis: Lovenox Code Status: full Family Communication:  Disposition Plan:   Status is: Inpatient  Remains inpatient appropriate because:Inpatient level of care appropriate due to severity of illness   Dispo: The patient is from: Home              Anticipated d/c is to: Home              Anticipated d/c date is: 2-3 days               Patient currently is not medically stable to d/c.  Still need >4L O2 with walking.     I/O last 3 completed shifts: In: -  Out: 600 [Urine:600] No intake/output data recorded.     Consultants:   none  Procedures: none  Antimicrobials:  Rocephin, zithromax  Subjective: Pt said he really needed to go home, and started to cry, but couldn't get up to walk with nurse today.     Objective: Vitals:   01/17/20 0654 01/17/20 0831 01/17/20 0905 01/17/20 1228  BP: 101/70 110/77  (!) 124/106  Pulse: 84 90  100  Resp: 18 18  20   Temp: (!) 97.4 F (36.3 C) 98.2 F (36.8 C)  98.1 F (36.7 C)  TempSrc: Oral Oral  Oral  SpO2: 93% 94% 95% 92%  Weight:      Height:        Intake/Output Summary (Last  24 hours) at 01/17/2020 1447 Last data filed at 01/17/2020 0656 Gross per 24 hour  Intake --  Output 200 ml  Net -200 ml   Filed Weights   01/15/20 0506 01/16/20 0600 01/17/20 0003  Weight: 65 kg 66.2 kg 66.8 kg    Examination:  Constitutional: NAD, AAOx3 HEENT: conjunctivae and lids normal, EOMI CV: No cyanosis.   RESP: normal respiratory effort, on 4L Extremities: No effusions, edema in BLE SKIN: warm, dry and intact Neuro: II - XII grossly intact.   Psych: depressed mood and affect.     Data Reviewed: I have personally reviewed following labs and imaging studies  CBC: Recent Labs  Lab  01/11/20 0425 01/11/20 0425 01/12/20 0503 01/12/20 0503 01/13/20 0421 01/14/20 0409 01/15/20 4081 01/16/20 0641 01/17/20 0638  WBC 5.0   < > 7.9   < > 10.9* 14.2* 11.7* 12.8* 14.7*  NEUTROABS 4.0  --  6.8  --  9.7*  --   --   --   --   HGB 15.8   < > 15.1   < > 15.3 15.7 16.2 16.6 17.2*  HCT 46.2   < > 44.1   < > 45.2 46.9 46.1 47.4 50.4  MCV 89.0   < > 88.9   < > 89.5 89.5 88.5 87.5 87.8  PLT 150   < > 140*   < > 143* 159 170 242 299   < > = values in this interval not displayed.   Basic Metabolic Panel: Recent Labs  Lab 01/13/20 0421 01/14/20 0409 01/15/20 0644 01/16/20 0641 01/17/20 0638  NA 134* 136 136 131* 135  K 5.2* 4.7 4.5 4.5 4.5  CL 104 102 102 96* 96*  CO2 24 26 26 26 30   GLUCOSE 215* 67* 103* 120* 114*  BUN 34* 30* 24* 27* 30*  CREATININE 0.87 1.02 0.96 1.04 1.12  CALCIUM 7.9* 8.2* 8.2* 8.8* 8.8*  MG 2.5* 2.4 2.4 2.2 2.3   GFR: Estimated Creatinine Clearance: 50.8 mL/min (by C-G formula based on SCr of 1.12 mg/dL). Liver Function Tests: Recent Labs  Lab 01/11/20 0425 01/12/20 0503 01/13/20 0421  AST 55* 42* 41  ALT 55* 43 41  ALKPHOS 73 61 61  BILITOT 1.2 0.9 0.8  PROT 6.6 5.8* 5.9*  ALBUMIN 2.3* 2.1* 2.3*   No results for input(s): LIPASE, AMYLASE in the last 168 hours. No results for input(s): AMMONIA in the last 168 hours. Coagulation Profile: No results for input(s): INR, PROTIME in the last 168 hours. Cardiac Enzymes: No results for input(s): CKTOTAL, CKMB, CKMBINDEX, TROPONINI in the last 168 hours. BNP (last 3 results) No results for input(s): PROBNP in the last 8760 hours. HbA1C: No results for input(s): HGBA1C in the last 72 hours. CBG: Recent Labs  Lab 01/16/20 1253 01/16/20 1756 01/16/20 2132 01/17/20 0828 01/17/20 1225  GLUCAP 242* 244* 158* 108* 61*   Lipid Profile: No results for input(s): CHOL, HDL, LDLCALC, TRIG, CHOLHDL, LDLDIRECT in the last 72 hours. Thyroid Function Tests: No results for input(s): TSH, T4TOTAL,  FREET4, T3FREE, THYROIDAB in the last 72 hours. Anemia Panel: No results for input(s): VITAMINB12, FOLATE, FERRITIN, TIBC, IRON, RETICCTPCT in the last 72 hours. Sepsis Labs: Recent Labs  Lab 01/12/20 0503  PROCALCITON 0.20    Recent Results (from the past 240 hour(s))  Blood culture (routine x 2)     Status: None   Collection Time: 01/10/20  1:28 PM   Specimen: BLOOD  Result Value Ref Range Status  Specimen Description BLOOD RAC  Final   Special Requests   Final    BOTTLES DRAWN AEROBIC AND ANAEROBIC Blood Culture adequate volume   Culture   Final    NO GROWTH 5 DAYS Performed at [facility], [address] [place], [place] [address]    Report Status 01/15/2020 FINAL  Final  Blood culture (routine x 2)     Status: None   Collection Time: 01/10/20  1:28 PM   Specimen: BLOOD  Result Value Ref Range Status   Specimen Description BLOOD LAC  Final   Special Requests   Final    BOTTLES DRAWN AEROBIC AND ANAEROBIC Blood Culture adequate volume   Culture   Final    NO GROWTH 5 DAYS Performed at [facility], [address] [place], [place] [address]    Report Status 01/15/2020 FINAL  Final         Radiology Studies: No results found.      Scheduled Meds: . atorvastatin  10 mg Oral QHS  . baricitinib  4 mg Oral Daily  . dexamethasone  6 mg Oral Daily  . enoxaparin (LOVENOX) injection  40 mg Subcutaneous Q24H  . finasteride  5 mg Oral Daily  . insulin aspart  0-9 Units Subcutaneous TID WC  . insulin aspart  5 Units Subcutaneous TID WC  . Ipratropium-Albuterol  1 puff Inhalation QID  . tamsulosin  0.4 mg Oral Daily   Continuous Infusions: . sodium chloride Stopped (01/13/20 1819)     LOS: 6 days     Enzo Bi, MD [facility]   To contact the attending provider between 7A-7P or the covering provider during after hours 7P-7A, please log into the web site [email or web address] and access using universal Tower password for that web  site. If you do not have the password, please call the hospital operator.  01/17/2020, 2:47 PM

## 2020-01-17 NOTE — Plan of Care (Signed)
  Problem: Education: Goal: Knowledge of General Education information will improve Description Including pain rating scale, medication(s)/side effects and non-pharmacologic comfort measures Outcome: Progressing   

## 2020-01-18 DIAGNOSIS — U071 COVID-19: Secondary | ICD-10-CM | POA: Diagnosis not present

## 2020-01-18 DIAGNOSIS — J9601 Acute respiratory failure with hypoxia: Secondary | ICD-10-CM | POA: Diagnosis not present

## 2020-01-18 LAB — CBC
HCT: 48.8 % (ref 39.0–52.0)
Hemoglobin: 16.9 g/dL (ref 13.0–17.0)
MCH: 30.7 pg (ref 26.0–34.0)
MCHC: 34.6 g/dL (ref 30.0–36.0)
MCV: 88.6 fL (ref 80.0–100.0)
Platelets: 318 10*3/uL (ref 150–400)
RBC: 5.51 MIL/uL (ref 4.22–5.81)
RDW: 12.9 % (ref 11.5–15.5)
WBC: 12.1 10*3/uL — ABNORMAL HIGH (ref 4.0–10.5)
nRBC: 0 % (ref 0.0–0.2)

## 2020-01-18 LAB — BASIC METABOLIC PANEL
Anion gap: 8 (ref 5–15)
BUN: 33 mg/dL — ABNORMAL HIGH (ref 8–23)
CO2: 27 mmol/L (ref 22–32)
Calcium: 9.2 mg/dL (ref 8.9–10.3)
Chloride: 99 mmol/L (ref 98–111)
Creatinine, Ser: 1.28 mg/dL — ABNORMAL HIGH (ref 0.61–1.24)
GFR, Estimated: 57 mL/min — ABNORMAL LOW (ref >60)
Glucose, Bld: 149 mg/dL — ABNORMAL HIGH (ref 70–99)
Potassium: 4.9 mmol/L (ref 3.5–5.1)
Sodium: 134 mmol/L — ABNORMAL LOW (ref 135–145)

## 2020-01-18 LAB — GLUCOSE, CAPILLARY
Glucose-Capillary: 158 mg/dL — ABNORMAL HIGH (ref 70–99)
Glucose-Capillary: 234 mg/dL — ABNORMAL HIGH (ref 70–99)
Glucose-Capillary: 229 mg/dL — ABNORMAL HIGH (ref 70–99)
Glucose-Capillary: 297 mg/dL — ABNORMAL HIGH (ref 70–99)

## 2020-01-18 LAB — MAGNESIUM: Magnesium: 2.2 mg/dL (ref 1.7–2.4)

## 2020-01-18 MED ORDER — GUAIFENESIN ER 600 MG PO TB12
600.0000 mg | ORAL_TABLET | Freq: Two times a day (BID) | ORAL | Status: DC
  Administered 2020-01-18 – 2020-01-20 (×5): 600 mg via ORAL
  Filled 2020-01-18 (×5): qty 1

## 2020-01-18 MED ORDER — BARICITINIB 2 MG PO TABS
2.0000 mg | ORAL_TABLET | Freq: Every day | ORAL | Status: DC
  Administered 2020-01-19 – 2020-01-20 (×2): 2 mg via ORAL
  Filled 2020-01-18 (×2): qty 1

## 2020-01-18 MED ORDER — POLYETHYLENE GLYCOL 3350 17 G PO PACK
34.0000 g | PACK | Freq: Two times a day (BID) | ORAL | Status: DC
  Administered 2020-01-18 – 2020-01-19 (×2): 34 g via ORAL
  Filled 2020-01-18 (×4): qty 2

## 2020-01-18 NOTE — Progress Notes (Signed)
PROGRESS NOTE    Steven Hall  YSA:[id number] DOB: [birth year] DOA: 01/10/2020 PCP: Tonia Ghent, MD   Chief complaint: shortness of breath. Brief Narrative:  Steven Yero Nanceis a 79 y.o.malewith medical history significant forBPH, hyperlipidemia on atorvastatin 10 mg daily presented to the emergency department at the urging of his son for worsening shortness of breath. He was diagnosed with COVID-19 on 12/31/2019. He states that he has not gotten his Covid vaccine.  Patient has severe respite failure, is placed on 100% heated high flow oxygen.  He is also placed on steroids, remdesivir and baricitinib.  He has a mild elevation of procalcitonin level at 0.4, start antibiotics with Zithromax and Rocephin.   Assessment & Plan:   Principal Problem:   Acute hypoxemic respiratory failure due to COVID-19 [facility]) Active Problems:   HLD (hyperlipidemia)   Hyperglycemia, drug-induced   Pneumonia due to COVID-19 virus   BPH (benign prostatic hyperplasia)   Traumatic periorbital ecchymosis of right eye   Elevated liver enzymes   Syncope and collapse   Acute hypoxemic respiratory failure  --on heated flow, with improved O2 requirement, 45% and 40L currently.  no respite distress. --weaned down to 4L at rest today, however, O2 sat dropped to 84% on 4L while walking. PLAN: --Continue supplemental O2 to keep sats between 88-92%, wean as tolerated, currently on 4L  COVID-19 pneumonia --started on solumedrol.  CRP 4.1 on 11/10, trending down --completed Remdesivir PLAN: --cont oral decadron --cont Baricitinib --cont combivent QID --add Mucinex BID  # Possible superimposed bacterial PNA --procal 0.4, started on Levofloxacin and doxy then ceftriaxone and azithromycin, finished 5 days of abx.  # significant elevation of D-dimer --D-dimer has been >7500 since presentation. --CTA chest ruled out PE.  Empiric Eliquis d/c'ed.  syncope. Secondary to hypoxemia.  3.  Mild LFT  elevation, resolved Secondary to Covid.    #4.  Hyperglycemia secondary to steroid use. # DM2 --A1c 7.2. --BG trending down, levemir d/c'ed. PLAN: --just SSI  # BPH --cont home finasteride and flomax  # HLD --cont home statin   DVT prophylaxis: Lovenox Code Status: full Family Communication: wife (who has dementia) and her daughter updated on the phone today Disposition Plan:   Status is: Inpatient  Remains inpatient appropriate because:Inpatient level of care appropriate due to severity of illness   Dispo: The patient is from: Home              Anticipated d/c is to: Home              Anticipated d/c date is: likely tomorrow               Patient currently is not medically stable to d/c.  Still need >4L O2 with walking.     I/O last 3 completed shifts: In: -  Out: 925 [Urine:925] Total I/O In: -  Out: 200 [Urine:200]     Consultants:   none  Procedures: none  Antimicrobials:  Rocephin, zithromax  Subjective: Pt reported starting to cough up sputum.  Pt was able to get up to walk with nursing today for O2 test, needed at least 4L O2.   Objective: Vitals:   01/18/20 0011 01/18/20 0428 01/18/20 0825 01/18/20 1214  BP: 105/74 103/76 110/81 113/85  Pulse: 77 76 81 95  Resp: 15 16 (!) 22 20  Temp: 98 F (36.7 C) 98.1 F (36.7 C) 98.3 F (36.8 C) 97.7 F (36.5 C)  TempSrc: Oral  Oral Oral  SpO2: 94%  99% 94% 94%  Weight:      Height:        Intake/Output Summary (Last 24 hours) at 01/18/2020 1553 Last data filed at 01/18/2020 1215 Gross per 24 hour  Intake --  Output 925 ml  Net -925 ml   Filed Weights   01/15/20 0506 01/16/20 0600 01/17/20 0003  Weight: 65 kg 66.2 kg 66.8 kg    Examination:  Constitutional: NAD, AAOx3 HEENT: conjunctivae and lids normal, EOMI CV: No cyanosis.   RESP: mild scattered rhonchi, on 4L Extremities: No effusions, edema in BLE SKIN: warm, dry and intact Neuro: II - XII grossly intact.      Data  Reviewed: I have personally reviewed following labs and imaging studies  CBC: Recent Labs  Lab 01/12/20 0503 01/12/20 0503 01/13/20 0421 01/13/20 0421 01/14/20 0409 01/15/20 9937 01/16/20 0641 01/17/20 0638 01/18/20 0553  WBC 7.9   < > 10.9*   < > 14.2* 11.7* 12.8* 14.7* 12.1*  NEUTROABS 6.8  --  9.7*  --   --   --   --   --   --   HGB 15.1   < > 15.3   < > 15.7 16.2 16.6 17.2* 16.9  HCT 44.1   < > 45.2   < > 46.9 46.1 47.4 50.4 48.8  MCV 88.9   < > 89.5   < > 89.5 88.5 87.5 87.8 88.6  PLT 140*   < > 143*   < > 159 170 242 299 318   < > = values in this interval not displayed.   Basic Metabolic Panel: Recent Labs  Lab 01/14/20 0409 01/15/20 0644 01/16/20 0641 01/17/20 0638 01/18/20 0553  NA 136 136 131* 135 134*  K 4.7 4.5 4.5 4.5 4.9  CL 102 102 96* 96* 99  CO2 26 26 26 30 27   GLUCOSE 67* 103* 120* 114* 149*  BUN 30* 24* 27* 30* 33*  CREATININE 1.02 0.96 1.04 1.12 1.28*  CALCIUM 8.2* 8.2* 8.8* 8.8* 9.2  MG 2.4 2.4 2.2 2.3 2.2   GFR: Estimated Creatinine Clearance: 44.5 mL/min (A) (by C-G formula based on SCr of 1.28 mg/dL (H)). Liver Function Tests: Recent Labs  Lab 01/12/20 0503 01/13/20 0421  AST 42* 41  ALT 43 41  ALKPHOS 61 61  BILITOT 0.9 0.8  PROT 5.8* 5.9*  ALBUMIN 2.1* 2.3*   No results for input(s): LIPASE, AMYLASE in the last 168 hours. No results for input(s): AMMONIA in the last 168 hours. Coagulation Profile: No results for input(s): INR, PROTIME in the last 168 hours. Cardiac Enzymes: No results for input(s): CKTOTAL, CKMB, CKMBINDEX, TROPONINI in the last 168 hours. BNP (last 3 results) No results for input(s): PROBNP in the last 8760 hours. HbA1C: No results for input(s): HGBA1C in the last 72 hours. CBG: Recent Labs  Lab 01/17/20 1225 01/17/20 1554 01/17/20 2141 01/18/20 0824 01/18/20 1216  GLUCAP 61* 243* 300* 158* 234*   Lipid Profile: No results for input(s): CHOL, HDL, LDLCALC, TRIG, CHOLHDL, LDLDIRECT in the last 72  hours. Thyroid Function Tests: No results for input(s): TSH, T4TOTAL, FREET4, T3FREE, THYROIDAB in the last 72 hours. Anemia Panel: No results for input(s): VITAMINB12, FOLATE, FERRITIN, TIBC, IRON, RETICCTPCT in the last 72 hours. Sepsis Labs: Recent Labs  Lab 01/12/20 0503  PROCALCITON 0.20    Recent Results (from the past 240 hour(s))  Blood culture (routine x 2)     Status: None   Collection Time: 01/10/20  1:28 PM  Specimen: BLOOD  Result Value Ref Range Status   Specimen Description BLOOD RAC  Final   Special Requests   Final    BOTTLES DRAWN AEROBIC AND ANAEROBIC Blood Culture adequate volume   Culture   Final    NO GROWTH 5 DAYS Performed at [facility], [address] [place], [place] [address]    Report Status 01/15/2020 FINAL  Final  Blood culture (routine x 2)     Status: None   Collection Time: 01/10/20  1:28 PM   Specimen: BLOOD  Result Value Ref Range Status   Specimen Description BLOOD LAC  Final   Special Requests   Final    BOTTLES DRAWN AEROBIC AND ANAEROBIC Blood Culture adequate volume   Culture   Final    NO GROWTH 5 DAYS Performed at [facility], [address], [place], [place] [address]    Report Status 01/15/2020 FINAL  Final         Radiology Studies: No results found.      Scheduled Meds:  atorvastatin  10 mg Oral QHS   [START ON 01/19/2020] baricitinib  2 mg Oral Daily   dexamethasone  6 mg Oral Daily   enoxaparin (LOVENOX) injection  40 mg Subcutaneous Q24H   finasteride  5 mg Oral Daily   guaiFENesin  600 mg Oral BID   insulin aspart  0-9 Units Subcutaneous TID WC   Ipratropium-Albuterol  1 puff Inhalation QID   polyethylene glycol  34 g Oral BID   tamsulosin  0.4 mg Oral Daily   Continuous Infusions:  sodium chloride Stopped (01/13/20 1819)     LOS: 7 days     Enzo Bi, MD [facility]   To contact the attending provider between 7A-7P or the covering provider during after  hours 7P-7A, please log into the web site [email or web address] and access using universal [place] password for that web site. If you do not have the password, please call the hospital operator.  01/18/2020, 3:53 PM

## 2020-01-18 NOTE — Care Management Important Message (Signed)
Important Message  Patient Details  Name: Steven Hall MRN: [id number] Date of Birth: [birth year]   Medicare Important Message Given:  Yes  IM left for RN to deliver due to patient's isolation status.  [place], LCSW 01/18/2020, 10:04 AM

## 2020-01-18 NOTE — TOC Initial Note (Addendum)
Transition of Care [facility]) - Initial/Assessment Note    Patient Details  Name: Steven Hall MRN: [id number] Date of Birth: 05-22-1940  Transition of Care [facility]) CM/SW Contact:    Magnus Ivan, LCSW Phone Number: 01/18/2020, 1:46 PM  Clinical Narrative:                CSW attempted call to patient's room x 2, patient is on Airborne Precautions. CSW then called patient's wife. She defers to daughter [name] due to wife having Dementia. [name] reported patient lives with his wife. [name] is currently on FMLA to help with patient and wife. Patient is healthy at his baseline per [place]. [facility] [place] PCP is Dr. Damita Dunnings. No DME, HH, or SNF history. Patient drives himself to appointments. Informed [name] of recommendations for home oxygen (waiting for qualifications), RW, and HHPT. She reported they are agreeable to all of these recommendations. She denied having an agency preference. CSW reached out to [place] with [facility] who is reviewing to see if they can accept patient for services. CSW informed Thedore Mins and Mardene Celeste with [facility] on need for oxygen and RW. Asked RN for qualifying sats so patient can get home oxygen.  3:00- [place] can accept patient for [facility] and PT.   Expected Discharge Plan: [place] Barriers to Discharge: Continued Medical Work up   Patient Goals and CMS Choice Patient states their goals for this hospitalization and ongoing recovery are:: home with home health CMS Medicare.gov Compare Post Acute Care list provided to:: Patient Represenative (must comment) Choice offered to / list presented to : Adult Children  Expected Discharge Plan and Services Expected Discharge Plan: [place]       Living arrangements for the past 2 months: Single Family Home                                      Prior Living Arrangements/Services Living arrangements for the past 2 months: Single Family  Home Lives with:: Spouse Patient language and need for interpreter reviewed:: Yes Do you feel safe going back to the place where you live?: Yes      Need for Family Participation in Patient Care: Yes (Comment) Care giver support system in place?: Yes (comment)   Criminal Activity/Legal Involvement Pertinent to Current Situation/Hospitalization: No - Comment as needed  Activities of Daily Living Home Assistive Devices/Equipment: None ADL Screening (condition at time of admission) Patient's cognitive ability adequate to safely complete daily activities?: Yes Is the patient deaf or have difficulty hearing?: No Does the patient have difficulty seeing, even when wearing glasses/contacts?: No Does the patient have difficulty concentrating, remembering, or making decisions?: No Patient able to express need for assistance with ADLs?: Yes Does the patient have difficulty dressing or bathing?: No Independently performs ADLs?: Yes (appropriate for developmental age) Does the patient have difficulty walking or climbing stairs?: Yes Weakness of Legs: Both Weakness of Arms/Hands: None  Permission Sought/Granted Permission sought to share information with : Chartered certified accountant granted to share information with : Yes, Verbal Permission Granted     Permission granted to share info w AGENCY: HH, DME agencies        Emotional Assessment         Alcohol / Substance Use: Not Applicable Psych Involvement: No (comment)  Admission diagnosis:  Acute respiratory failure with  hypoxia ([place]) [J96.01] Community acquired pneumonia, unspecified laterality [J18.9] Acute hypoxemic respiratory failure due to COVID-19 ([place]) [U07.1, J96.01] Pneumonia due to COVID-19 virus [U07.1, J12.82] COVID-19 [U07.1] Patient Active Problem List   Diagnosis Date Noted  . Syncope and collapse 01/12/2020  . Elevated liver enzymes 01/11/2020  . Pneumonia due to COVID-19 virus 01/10/2020  . BPH  (benign prostatic hyperplasia) 01/10/2020  . Acute hypoxemic respiratory failure due to COVID-19 ([place]) 01/10/2020  . Traumatic periorbital ecchymosis of right eye 01/10/2020  . Diabetes mellitus without complication ([place]) 74/25/9563  . Healthcare maintenance 12/14/2016  . Hyperkalemia 12/14/2016  . Hyperglycemia, drug-induced 03/12/2014  . Advance care planning 03/10/2014  . External hemorrhoid 12/15/2012  . Medicare annual wellness visit, subsequent 05/25/2010  . INGUINAL PAIN, RIGHT 03/15/2010  . BENIGN POSITIONAL VERTIGO 08/18/2008  . HLD (hyperlipidemia) 08/05/2007  . Enlarged prostate with lower urinary tract symptoms (LUTS) 08/05/2007  . 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 ALLERGY, ENVIRONMENTAL 08/05/2007   PCP:  Tonia Ghent, MD Pharmacy[facility]   [id number] [place], [place] [address] [address] Phone: [phone] Fax: [phone]  [facility] [place], [place] [address] Phone: [phone] Fax: [phone]  [facility] [place], [place] [address] [place] [address] Phone: [phone] Fax: [phone]     Social Determinants of Health (SDOH) Interventions    Readmission Risk Interventions No flowsheet data found.

## 2020-01-18 NOTE — Progress Notes (Signed)
PHARMACY NOTE:  RENAL DOSAGE ADJUSTMENT  Current antimicrobial regimen includes a mismatch between antimicrobial dosage and estimated renal function.  As per policy approved by the Pharmacy & Therapeutics and Medical Executive Committees, the antimicrobial dosage will be adjusted accordingly.  Current dosage:  baricitinib  Indication: COVID  Renal Function:  Estimated Creatinine Clearance: 44.5 mL/min (A) (by C-G formula based on SCr of 1.28 mg/dL (H)).  Estimated GFR < 60 ml/min    Antimicrobial dosage has been changed to:  baricitinib 2mg    Thank you for allowing pharmacy to be a part of this patient's care.  Lu Duffel, PharmD, BCPS Clinical Pharmacist 01/18/2020 10:35 AM

## 2020-01-18 NOTE — Progress Notes (Signed)
Physical Therapy Treatment Patient Details Name: Steven Hall MRN: [id number] DOB: June 07, 1940 Today's Date: 01/18/2020    History of Present Illness Steven Hall is a 69yoM who comes to [facility] on 11/7 for SOB, progressive weakness. He was diagnosed with COVID on 12/31/19. Prior to going to ED patietn was sitting on couch when he bent over and lost conciousness hitting his head ont he coffee table.  PMH of BPH, vertigo, HLD on atorvastatin. Patient used to work for [employer], lives with wife.    PT Comments    Pt now on 1C, down from PCU upstairs. Pt remains on 4L O2. Trial of AMB with RW this date, able to AMB 77ft with desaturation to high 80s%. Pt moved to 6L to see if AMB better tolerated, when in fact, it appears to be somewhat worse in recovery. Pt AMB 163ft c RW, no unsteadiness, terminal SpO2 in upper 70s%, recovery back to 88% is drawn out over 2-3 minutes, mild tightness in chest, rate in 110s. SpO2 confirmed with 2nd oximeter. Session ended due to decline in recovery, pt left on BSC to produce bowel movement as desired. Pt left on 6L/min at EOS. Pt is modI with mobility with RW, but O2 limitations remain high. Pt reports remote broken nose with chronic difficulty in nasal inhalation.   Follow Up Recommendations  Home health PT;Supervision - Intermittent;Other (comment)     Equipment Recommendations  Rolling walker with 5" wheels    Recommendations for Other Services       Precautions / Restrictions Precautions Precautions: Fall Precaution Comments: air/con ISO    Mobility  Bed Mobility Overal bed mobility: Modified Independent                Transfers Overall transfer level: Modified independent Equipment used: Rolling walker (2 wheeled)   Sit to Stand: Modified independent (Device/Increase time) Stand pivot transfers: Modified independent (Device/Increase time)          Ambulation/Gait Ambulation/Gait assistance: Min guard;Supervision Gait  Distance (Feet): 120 Feet Assistive device: Rolling walker (2 wheeled) Gait Pattern/deviations: WFL(Within Functional Limits)     General Gait Details: author manages O2 line for AMB in room; 50ft on 4L/min (87% SpO2); 123ft on 6L [place] (79% SpO2 with delayed recovery, still at 87% after 2 minutes)   Stairs             Wheelchair Mobility    Modified Rankin (Stroke Patients Only)       Balance Overall balance assessment: Mild deficits observed, not formally tested;Modified Independent                                          Cognition Arousal/Alertness: Awake/alert Behavior During Therapy: WFL for tasks assessed/performed Overall Cognitive Status: Within Functional Limits for tasks assessed                                        Exercises      General Comments        Pertinent Vitals/Pain Pain Assessment: No/denies pain    Home Living                      Prior Function            PT Goals (current goals can  now be found in the care plan section) Acute Rehab PT Goals Patient Stated Goal: return to baseline mobility PT Goal Formulation: With patient Time For Goal Achievement: 01/30/20 Potential to Achieve Goals: Good Progress towards PT goals: Progressing toward goals    Frequency    Min 2X/week      PT Plan Current plan remains appropriate    Co-evaluation              AM-PAC PT "6 Clicks" Mobility   Outcome Measure  Help needed turning from your back to your side while in a flat bed without using bedrails?: None Help needed moving from lying on your back to sitting on the side of a flat bed without using bedrails?: None Help needed moving to and from a bed to a chair (including a wheelchair)?: A Little Help needed standing up from a chair using your arms (e.g., wheelchair or bedside chair)?: A Little Help needed to walk in hospital room?: A Little Help needed climbing 3-5 steps with a railing?  : A Little 6 Click Score: 20    End of Session Equipment Utilized During Treatment: Oxygen Activity Tolerance: Patient tolerated treatment well;Treatment limited secondary to medical complications (Comment);No increased pain Patient left: with call bell/phone within reach [facility]) Nurse Communication: Mobility status PT Visit Diagnosis: Unsteadiness on feet (R26.81);Difficulty in walking, not elsewhere classified (R26.2);Other abnormalities of gait and mobility (R26.89)     Time: 4383-8184 PT Time Calculation (min) (ACUTE ONLY): 15 min  Charges:  $Therapeutic Exercise: 8-22 mins                     4:29 PM, 01/18/20 Etta Grandchild, PT, DPT Physical Therapist - [facility]  [phone] ([place])    Hersh Minney C 01/18/2020, 4:24 PM

## 2020-01-18 NOTE — Progress Notes (Signed)
SATURATION QUALIFICATIONS: (This note is used to comply with regulatory documentation for home oxygen)  Patient Saturations on Room Air at Rest = 97%   Patient Saturations on 4 Liters of oxygen while Ambulating = 96%  Please briefly explain why patient needs home oxygen:   Patient ambulated to rest room and back to bed on 4 Liters of O2. Patient began to desat down to the 88-89% afterwards. Also patient at room air while resting is 97%

## 2020-01-19 DIAGNOSIS — U071 COVID-19: Secondary | ICD-10-CM | POA: Diagnosis not present

## 2020-01-19 DIAGNOSIS — J9601 Acute respiratory failure with hypoxia: Secondary | ICD-10-CM | POA: Diagnosis not present

## 2020-01-19 LAB — BASIC METABOLIC PANEL
Anion gap: 9 (ref 5–15)
BUN: 30 mg/dL — ABNORMAL HIGH (ref 8–23)
CO2: 31 mmol/L (ref 22–32)
Calcium: 8.7 mg/dL — ABNORMAL LOW (ref 8.9–10.3)
Chloride: 94 mmol/L — ABNORMAL LOW (ref 98–111)
Creatinine, Ser: 1.27 mg/dL — ABNORMAL HIGH (ref 0.61–1.24)
GFR, Estimated: 58 mL/min — ABNORMAL LOW (ref >60)
Glucose, Bld: 127 mg/dL — ABNORMAL HIGH (ref 70–99)
Potassium: 5 mmol/L (ref 3.5–5.1)
Sodium: 134 mmol/L — ABNORMAL LOW (ref 135–145)

## 2020-01-19 LAB — GLUCOSE, CAPILLARY
Glucose-Capillary: 208 mg/dL — ABNORMAL HIGH (ref 70–99)
Glucose-Capillary: 255 mg/dL — ABNORMAL HIGH (ref 70–99)
Glucose-Capillary: 274 mg/dL — ABNORMAL HIGH (ref 70–99)
Glucose-Capillary: 188 mg/dL — ABNORMAL HIGH (ref 70–99)

## 2020-01-19 LAB — CBC
HCT: 47.5 % (ref 39.0–52.0)
Hemoglobin: 16.4 g/dL (ref 13.0–17.0)
MCH: 30.6 pg (ref 26.0–34.0)
MCHC: 34.5 g/dL (ref 30.0–36.0)
MCV: 88.6 fL (ref 80.0–100.0)
Platelets: 334 10*3/uL (ref 150–400)
RBC: 5.36 MIL/uL (ref 4.22–5.81)
RDW: 12.9 % (ref 11.5–15.5)
WBC: 12.4 10*3/uL — ABNORMAL HIGH (ref 4.0–10.5)
nRBC: 0 % (ref 0.0–0.2)

## 2020-01-19 LAB — MAGNESIUM: Magnesium: 2.3 mg/dL (ref 1.7–2.4)

## 2020-01-19 MED ORDER — IPRATROPIUM-ALBUTEROL 20-100 MCG/ACT IN AERS
1.0000 | INHALATION_SPRAY | Freq: Four times a day (QID) | RESPIRATORY_TRACT | 0 refills | Status: DC
Start: 2020-01-19 — End: 2020-05-05

## 2020-01-19 NOTE — Progress Notes (Signed)
Ambulated patient in hallway to determine oxygen needs. Patient walked a total of 40 feet. We began at side of bed on RA. His O2 sats were between 82-84% RA. We then applied O2 and had to increase to 4L while at rest to maintain O2 sats at 89%. We began walking with 4L [place] he started to desat to low 80s, upper 70s. We stopped in hall. He stated that he was having some indigestion and asked if we could return to room. He sat down on side of bed and his oxygen level had to be elevated to 6L for him to maintain a pulse ox level of 90%. He is currently back in bed with call bell in reach, bed in low position.

## 2020-01-19 NOTE — Progress Notes (Signed)
Ambulated patient again in room, from door to bed three times and patient on RA desaturates to 82%. Applied O2 at 5L [place] for patient to maintain saturation at 88-90%. He sat on edge of bed and took a few deep breaths and his O2 did raise to 93% on 5L [place]. Heart rate is elevated, did get as high as 115 while ambulating. He is back in bed with call bell in reach and bed in low position. Notifying Dr. Billie Ruddy of results.

## 2020-01-19 NOTE — TOC Transition Note (Addendum)
Transition of Care [facility]) - CM/SW Discharge Note   Patient Details  Name: Steven Hall MRN: [id number] Date of Birth: [birth year]  Transition of Care [facility]) CM/SW Contact:  Magnus Ivan, LCSW Phone Number: 01/19/2020, 10:22 AM   Clinical Narrative:   Patient to discharge home today. CSW informed [employer] Representative Thedore Mins of new o2 qualifying sats to be done today prior to discharge. Patient will have a RW and o2 through [employer] as well as [place] PT and RN services. Attempted call to wife/daughter, no answer and voicemail box full.  11:00- Attempted call to wife/daughter again, wife answered and said daughter went to the store and will be back soon.   1:12- Patient told MD he does not want staff talking to his family even though his wife is listed as a contact. CSW asked RN to update patient of the above next time she goes in patient's room since patient is on Airborne Precautions and did not answer his room phone yesterday or today. RN to provide CSW's number to patient to call with any questions or additional needs prior to patient's discharge.   Final next level of care: [place] Barriers to Discharge: Barriers Resolved   Patient Goals and CMS Choice Patient states their goals for this hospitalization and ongoing recovery are:: home with home health CMS Medicare.gov Compare Post Acute Care list provided to:: Patient Represenative (must comment) Choice offered to / list presented to : Adult Children  Discharge Placement                Patient to be transferred to facility by: family Name of family member notified: attempted call to daughter/wife Patient and family notified of of transfer: 01/19/20  Discharge Plan and Services                DME Arranged: Oxygen, Walker rolling DME Agency: [facility] Date DME Agency Contacted: 01/19/20   Representative spoke with at DME Agency: Thedore Mins and Mardene Celeste HH Arranged: PT, RN [facility] Agency: [place] ([place]) Date [place]: 01/19/20   Representative spoke with at [place]: [place] ([place]) Interventions     Readmission Risk Interventions No flowsheet data found.

## 2020-01-19 NOTE — Progress Notes (Signed)
PROGRESS NOTE    Steven Hall  UUV:[id number] DOB: Jan 15, 1941 DOA: 01/10/2020 PCP: Tonia Ghent, MD   Chief complaint: shortness of breath. Brief Narrative:  Steven Hall a 79 y.o.malewith medical history significant forBPH, hyperlipidemia on atorvastatin 10 mg daily presented to the emergency department at the urging of his son for worsening shortness of breath. He was diagnosed with COVID-19 on 12/31/2019. He states that he has not gotten his Covid vaccine.  Patient has severe respite failure, is placed on 100% heated high flow oxygen.  He is also placed on steroids, remdesivir and baricitinib.  He has a mild elevation of procalcitonin level at 0.4, start antibiotics with Zithromax and Rocephin.   Assessment & Plan:   Principal Problem:   Acute hypoxemic respiratory failure due to COVID-19 [facility]) Active Problems:   HLD (hyperlipidemia)   Hyperglycemia, drug-induced   Pneumonia due to COVID-19 virus   BPH (benign prostatic hyperplasia)   Traumatic periorbital ecchymosis of right eye   Elevated liver enzymes   Syncope and collapse   Acute hypoxemic respiratory failure  --on heated flow, with improved O2 requirement, 45% and 40L currently.  no respite distress. --weaned down to 4L at rest today, however, needed 5L O2 to maintain sats >=88%. PLAN: --Continue supplemental O2 to keep sats between 88-92%, wean as tolerated.   --will discharge when O2 requirement is 4L or less with walking.  COVID-19 pneumonia --started on solumedrol.  CRP 4.1 on 11/10, trending down --completed Remdesivir PLAN: --cont oral decadron while inpatient --cont Baricitinib while inpatient --cont combivent QID --continue Mucinex BID  # Possible superimposed bacterial PNA --procal 0.4, started on Levofloxacin and doxy then ceftriaxone and azithromycin, finished 5 days of abx.  # significant elevation of D-dimer --D-dimer has been >7500 since presentation. --CTA chest ruled out PE.   Empiric Eliquis d/c'ed.  syncope. Secondary to hypoxemia.  3.  Mild LFT elevation, resolved Secondary to Covid.    #4.  Hyperglycemia secondary to steroid use. # DM2 --A1c 7.2. --BG trending down, levemir d/c'ed. PLAN: --just SSI  # BPH --cont home finasteride and flomax  # HLD --cont home statin   DVT prophylaxis: Lovenox Code Status: full Family Communication: pt does NOT want his wife or wife's daughter updated.  Disposition Plan:   Status is: Inpatient  Remains inpatient appropriate because:Inpatient level of care appropriate due to severity of illness   Dispo: The patient is from: Home              Anticipated d/c is to: Home with HHPT/RN              Anticipated d/c date is: likely tomorrow               Patient currently is not medically stable to d/c.  Still need >4L O2 with walking.     I/O last 3 completed shifts: In: -  Out: 1125 [Urine:1125] No intake/output data recorded.     Consultants:   none  Procedures: none  Antimicrobials:  Rocephin, zithromax  Subjective: Pt said he has been up and walking.  Nurse checked O2 requirement while walking, and needed 5L O2 to maintain sats >=88%, also HR elevated to 115.     Objective: Vitals:   01/19/20 0443 01/19/20 0750 01/19/20 0803 01/19/20 1149  BP: 110/78 103/80  118/81  Pulse: 68 87  94  Resp: 17 17  18   Temp: 98.3 F (36.8 C) 97.7 F (36.5 C)  97.6 F (36.4 C)  TempSrc:  Oral  Oral  SpO2: 98% 95% 95% 97%  Weight:      Height:        Intake/Output Summary (Last 24 hours) at 01/19/2020 1533 Last data filed at 01/19/2020 0100 Gross per 24 hour  Intake --  Output 400 ml  Net -400 ml   Filed Weights   01/15/20 0506 01/16/20 0600 01/17/20 0003  Weight: 65 kg 66.2 kg 66.8 kg    Examination:  Constitutional: NAD, AAOx3 HEENT: conjunctivae and lids normal, EOMI CV: No cyanosis.   RESP: normal respiratory effort, on 4L Extremities: No effusions, edema in BLE SKIN: warm, dry  and intact Neuro: II - XII grossly intact.      Data Reviewed: I have personally reviewed following labs and imaging studies  CBC: Recent Labs  Lab 01/13/20 0421 01/14/20 0409 01/15/20 0644 01/16/20 0641 01/17/20 0638 01/18/20 0553 01/19/20 0539  WBC 10.9*   < > 11.7* 12.8* 14.7* 12.1* 12.4*  NEUTROABS 9.7*  --   --   --   --   --   --   HGB 15.3   < > 16.2 16.6 17.2* 16.9 16.4  HCT 45.2   < > 46.1 47.4 50.4 48.8 47.5  MCV 89.5   < > 88.5 87.5 87.8 88.6 88.6  PLT 143*   < > 170 242 299 318 334   < > = values in this interval not displayed.   Basic Metabolic Panel: Recent Labs  Lab 01/15/20 0644 01/16/20 0641 01/17/20 0638 01/18/20 0553 01/19/20 0539  NA 136 131* 135 134* 134*  K 4.5 4.5 4.5 4.9 5.0  CL 102 96* 96* 99 94*  CO2 26 26 30 27 31   GLUCOSE 103* 120* 114* 149* 127*  BUN 24* 27* 30* 33* 30*  CREATININE 0.96 1.04 1.12 1.28* 1.27*  CALCIUM 8.2* 8.8* 8.8* 9.2 8.7*  MG 2.4 2.2 2.3 2.2 2.3   GFR: Estimated Creatinine Clearance: 44.8 mL/min (A) (by C-G formula based on SCr of 1.27 mg/dL (H)). Liver Function Tests: Recent Labs  Lab 01/13/20 0421  AST 41  ALT 41  ALKPHOS 61  BILITOT 0.8  PROT 5.9*  ALBUMIN 2.3*   No results for input(s): LIPASE, AMYLASE in the last 168 hours. No results for input(s): AMMONIA in the last 168 hours. Coagulation Profile: No results for input(s): INR, PROTIME in the last 168 hours. Cardiac Enzymes: No results for input(s): CKTOTAL, CKMB, CKMBINDEX, TROPONINI in the last 168 hours. BNP (last 3 results) No results for input(s): PROBNP in the last 8760 hours. HbA1C: No results for input(s): HGBA1C in the last 72 hours. CBG: Recent Labs  Lab 01/18/20 1216 01/18/20 1628 01/18/20 2008 01/19/20 0751 01/19/20 1154  GLUCAP 234* 229* 297* 208* 255*   Lipid Profile: No results for input(s): CHOL, HDL, LDLCALC, TRIG, CHOLHDL, LDLDIRECT in the last 72 hours. Thyroid Function Tests: No results for input(s): TSH, T4TOTAL,  FREET4, T3FREE, THYROIDAB in the last 72 hours. Anemia Panel: No results for input(s): VITAMINB12, FOLATE, FERRITIN, TIBC, IRON, RETICCTPCT in the last 72 hours. Sepsis Labs: No results for input(s): PROCALCITON, LATICACIDVEN in the last 168 hours.  Recent Results (from the past 240 hour(s))  Blood culture (routine x 2)     Status: None   Collection Time: 01/10/20  1:28 PM   Specimen: BLOOD  Result Value Ref Range Status   Specimen Description BLOOD RAC  Final   Special Requests   Final    BOTTLES DRAWN AEROBIC AND ANAEROBIC  Blood Culture adequate volume   Culture   Final    NO GROWTH 5 DAYS Performed at [facility], [address] [place], [place] [address]    Report Status 01/15/2020 FINAL  Final  Blood culture (routine x 2)     Status: None   Collection Time: 01/10/20  1:28 PM   Specimen: BLOOD  Result Value Ref Range Status   Specimen Description BLOOD LAC  Final   Special Requests   Final    BOTTLES DRAWN AEROBIC AND ANAEROBIC Blood Culture adequate volume   Culture   Final    NO GROWTH 5 DAYS Performed at [facility], [address], [place], [place] [address]    Report Status 01/15/2020 FINAL  Final         Radiology Studies: No results found.      Scheduled Meds:  atorvastatin  10 mg Oral QHS   baricitinib  2 mg Oral Daily   dexamethasone  6 mg Oral Daily   enoxaparin (LOVENOX) injection  40 mg Subcutaneous Q24H   finasteride  5 mg Oral Daily   guaiFENesin  600 mg Oral BID   insulin aspart  0-9 Units Subcutaneous TID WC   Ipratropium-Albuterol  1 puff Inhalation QID   polyethylene glycol  34 g Oral BID   tamsulosin  0.4 mg Oral Daily   Continuous Infusions:  sodium chloride Stopped (01/13/20 1819)     LOS: 8 days     Enzo Bi, MD [facility]   To contact the attending provider between 7A-7P or the covering provider during after hours 7P-7A, please log into the web site [email or web address] and access using  universal [place] password for that web site. If you do not have the password, please call the hospital operator.  01/19/2020, 3:33 PM

## 2020-01-20 ENCOUNTER — Telehealth: Payer: Self-pay

## 2020-01-20 ENCOUNTER — Telehealth: Payer: Self-pay | Admitting: Family Medicine

## 2020-01-20 DIAGNOSIS — U071 COVID-19: Secondary | ICD-10-CM | POA: Diagnosis not present

## 2020-01-20 DIAGNOSIS — J9601 Acute respiratory failure with hypoxia: Secondary | ICD-10-CM | POA: Diagnosis not present

## 2020-01-20 LAB — CBC
HCT: 48.4 % (ref 39.0–52.0)
Hemoglobin: 16.1 g/dL (ref 13.0–17.0)
MCH: 30.1 pg (ref 26.0–34.0)
MCHC: 33.3 g/dL (ref 30.0–36.0)
MCV: 90.6 fL (ref 80.0–100.0)
Platelets: 329 10*3/uL (ref 150–400)
RBC: 5.34 MIL/uL (ref 4.22–5.81)
RDW: 12.9 % (ref 11.5–15.5)
WBC: 12.5 10*3/uL — ABNORMAL HIGH (ref 4.0–10.5)
nRBC: 0 % (ref 0.0–0.2)

## 2020-01-20 LAB — BASIC METABOLIC PANEL
Anion gap: 10 (ref 5–15)
BUN: 30 mg/dL — ABNORMAL HIGH (ref 8–23)
CO2: 32 mmol/L (ref 22–32)
Calcium: 9 mg/dL (ref 8.9–10.3)
Chloride: 93 mmol/L — ABNORMAL LOW (ref 98–111)
Creatinine, Ser: 1.26 mg/dL — ABNORMAL HIGH (ref 0.61–1.24)
GFR, Estimated: 58 mL/min — ABNORMAL LOW (ref >60)
Glucose, Bld: 132 mg/dL — ABNORMAL HIGH (ref 70–99)
Potassium: 5.3 mmol/L — ABNORMAL HIGH (ref 3.5–5.1)
Sodium: 135 mmol/L (ref 135–145)

## 2020-01-20 LAB — MAGNESIUM: Magnesium: 2.7 mg/dL — ABNORMAL HIGH (ref 1.7–2.4)

## 2020-01-20 LAB — GLUCOSE, CAPILLARY: Glucose-Capillary: 147 mg/dL — ABNORMAL HIGH (ref 70–99)

## 2020-01-20 NOTE — Telephone Encounter (Signed)
Please give the order.  Thanks.   

## 2020-01-20 NOTE — Telephone Encounter (Signed)
Noted. Thanks.

## 2020-01-20 NOTE — Telephone Encounter (Signed)
Tiffany called wanted to get verbal consent to start home health and physical therapy to start 01/23/20

## 2020-01-20 NOTE — Telephone Encounter (Signed)
Verbal order given to Darlina Sicilian with [facility] per Dr. Damita Dunnings

## 2020-01-20 NOTE — Discharge Summary (Signed)
[facility] [place] NAME: Steven Hall    MR#:  [id number]  DATE OF BIRTH:  1940/04/07  DATE OF ADMISSION:  01/10/2020 ADMITTING PHYSICIAN: Sharen Hones, MD  DATE OF DISCHARGE: 01/20/2020  PRIMARY CARE PHYSICIAN: Tonia Ghent, MD    ADMISSION DIAGNOSIS:  Acute respiratory failure with hypoxia ([place]) [J96.01] Community acquired pneumonia, unspecified laterality [J18.9] Acute hypoxemic respiratory failure due to COVID-19 ([place]) [U07.1, J96.01] Pneumonia due to COVID-19 virus [U07.1, J12.82] COVID-19 [U07.1]  DISCHARGE DIAGNOSIS:  Acute hypoxic respiratory failure secondary to COVID-19(U07.1, COVID-19) with Acute Pneumonia  SECONDARY DIAGNOSIS:   Past Medical History:  Diagnosis Date  . Allergy   . Arthritis    B hands  . Back pain    lumbar disc disease  . BPH (benign prostatic hyperplasia)   . ED (erectile dysfunction)   . Hyperlipidemia   . Inguinal hernia   . Vertigo     HOSPITAL COURSE:  Steven Hall a 79 y.o.malewith medical history significant forBPH, hyperlipidemia on atorvastatin 10 mg daily presented to the emergency department at the urging of his son for worsening shortness of breath. He was diagnosed with COVID-19 on 12/31/2019. He states that he has not gotten his Covid vaccine. Acute hypoxemic respiratory failure due to COVID pneumonia/Bilateral --was on heated flow, with improved O2 requirement and currently stas >88% on 4 Liter [place]--  no respite distress. Patient able to complete full sentences without respiratory distress. --Continue supplemental O2 to keep sats between 88-92%, wean as tolerated.   -- patient is feeling well. He is a bit frustrated since he is not able to walk around feels he'll get more week here. Labs are stable. Overall vital signs stable.  COVID-19 pneumonia --CRP 4.1 on 11/10, trending down --completed Remdesivir --cont oral decadron while inpatient--completed 10 days --cont  Baricitinib while inpatient --no rx at d/c --cont combivent QID --continue Mucinex BID -- continue incentive spirometer and 4 L nasal cannula oxygen at home. Patient is recommended to pace himself at home to avoid respiratory distress. He voiced understanding and is wanting to discharge to go home.  # Possible superimposed bacterial PNA --procal 0.4, started on Levofloxacin and doxy then ceftriaxone and azithromycin, finished 5 days of abx.  # significant elevation of D-dimer --D-dimer has been >7500 since presentation. --CTA chest ruled out PE.  Empiric Eliquis d/c'ed.  #syncope. Secondary to hypoxemia.  3.  Mild LFT elevation, resolved Secondary to Covid.    #4.  Hyperglycemia secondary to steroid use. # DM2 --A1c 7.2. --BG trending down, levemir d/c'ed. --just SSI -- sugars are being monitored by patient's PCP Dr. Damita Dunnings as outpatient.  # BPH --cont home finasteride and flomax  # HLD --cont home statin   DVT prophylaxis: Lovenox Code Status: full Family Communication: pt does not want his wife or wife's daughter updated.  Disposition Plan:   Status is: Inpatient  Remains inpatient appropriate because:Inpatient level of care appropriate due to severity of illness   Dispo: The patient is from: Home  Anticipated d/c is to: Home with HHPT/RN and oxygen  Anticipated d/c date PZ:[facility].    Patient currently is medically best at baseline respiratory status wise. He feels strong. Patient feels he is ready to go home. I have asked him to pace himself in order to avoid respiratory distress. He did voice understanding.       CONSULTS OBTAINED:    DRUG ALLERGIES:   Allergies  Allergen Reactions  . Penicillins  REACTION: syncope  . Other     Unrecalled sleep aid- caused insomnia  . Tramadol Other (See Comments)    Panic/cold sweats after stopping medicine (after about 5 days of use for foot pain)    DISCHARGE  MEDICATIONS:   Allergies as of 01/20/2020      Reactions   Penicillins    REACTION: syncope   Other    Unrecalled sleep aid- caused insomnia   Tramadol Other (See Comments)   Panic/cold sweats after stopping medicine (after about 5 days of use for foot pain)      Medication List    STOP taking these medications   meloxicam 15 MG tablet Commonly known as: MOBIC     TAKE these medications   atorvastatin 10 MG tablet Commonly known as: LIPITOR Take 1 tablet (10 mg total) by mouth at bedtime.   finasteride 5 MG tablet Commonly known as: PROSCAR TAKE 1 TABLET DAILY   Ipratropium-Albuterol 20-100 MCG/ACT Aers respimat Commonly known as: COMBIVENT Inhale 1 puff into the lungs 4 (four) times daily for 7 days.   loratadine 10 MG tablet Commonly known as: CLARITIN Take 10 mg by mouth daily as needed for allergies.   tamsulosin 0.4 MG Caps capsule Commonly known as: FLOMAX TAKE 1 CAPSULE DAILY            Durable Medical Equipment  (From admission, onward)         Start     Ordered   01/20/20 0839  For home use only DME oxygen  Once       Question Answer Comment  Length of Need 6 Months   Mode or (Route) Nasal cannula   Liters per Minute 4   Frequency Continuous (stationary and portable oxygen unit needed)   Oxygen conserving device Yes   Oxygen delivery system Gas      01/20/20 0838   01/18/20 1523  For home use only DME oxygen  Once       Question Answer Comment  Length of Need 6 Months   Mode or (Route) Nasal cannula   Liters per Minute 4   Frequency Continuous (stationary and portable oxygen unit needed)   Oxygen delivery system Gas      01/18/20 1522          If you experience worsening of your admission symptoms, develop shortness of breath, life threatening emergency, suicidal or homicidal thoughts you must seek medical attention immediately by calling 911 or calling your MD immediately  if symptoms less severe.  You Must read complete  instructions/literature along with all the possible adverse reactions/side effects for all the Medicines you take and that have been prescribed to you. Take any new Medicines after you have completely understood and accept all the possible adverse reactions/side effects.   Please note  You were cared for by a hospitalist during your hospital stay. If you have any questions about your discharge medications or the care you received while you were in the hospital after you are discharged, you can call the unit and asked to speak with the hospitalist on call if the hospitalist that took care of you is not available. Once you are discharged, your primary care physician will handle any further medical issues. Please note that NO REFILLS for any discharge medications will be authorized once you are discharged, as it is imperative that you return to your primary care physician (or establish a relationship with a primary care physician if you do not have one)  for your aftercare needs so that they can reassess your need for medications and monitor your lab values. Today   SUBJECTIVE   I feel okay and I would like to go home. I am so ready to get out of here. No respiratory distress noted while patient talking.  VITAL SIGNS:  Blood pressure 109/79, pulse 91, temperature 97.7 F (36.5 C), temperature source Oral, resp. rate 20, height 5\' 7"  (1.702 m), weight 66.8 kg, SpO2 93 %.  I/O:    Intake/Output Summary (Last 24 hours) at 01/20/2020 0844 Last data filed at 01/19/2020 2036 Gross per 24 hour  Intake --  Output 250 ml  Net -250 ml    PHYSICAL EXAMINATION:  GENERAL:  79 y.o.-year-old patient lying in the bed with no acute distress.  EYES: Pupils equal, round, reactive to light and accommodation. No scleral icterus.  HEENT: Head atraumatic, normocephalic. Oropharynx and nasopharynx clear.  NECK:  Supple, no jugular venous distention. No thyroid enlargement, no tenderness.  LUNGS: decreased breath  sounds bilaterally bases, no wheezing, rales,rhonchi or crepitation. No use of accessory muscles of respiration.  CARDIOVASCULAR: S1, S2 normal. No murmurs, rubs, or gallops.  ABDOMEN: Soft, non-tender, non-distended. Bowel sounds present. No organomegaly or mass.  EXTREMITIES: No pedal edema, cyanosis, or clubbing.  NEUROLOGIC: Cranial nerves II through XII are intact. Muscle strength 5/5 in all extremities. Sensation intact. Gait not checked.  PSYCHIATRIC: The patient is alert and oriented x 3.  SKIN: No obvious rash, lesion, or ulcer.   DATA REVIEW:   CBC  Recent Labs  Lab 01/20/20 0529  WBC 12.5*  HGB 16.1  HCT 48.4  PLT 329    Chemistries  Recent Labs  Lab 01/20/20 0529  NA 135  K 5.3*  CL 93*  CO2 32  GLUCOSE 132*  BUN 30*  CREATININE 1.26*  CALCIUM 9.0  MG 2.7*    Microbiology Results   Recent Results (from the past 240 hour(s))  Blood culture (routine x 2)     Status: None   Collection Time: 01/10/20  1:28 PM   Specimen: BLOOD  Result Value Ref Range Status   Specimen Description BLOOD RAC  Final   Special Requests   Final    BOTTLES DRAWN AEROBIC AND ANAEROBIC Blood Culture adequate volume   Culture   Final    NO GROWTH 5 DAYS Performed at [facility], [address] [place], [place] [address]    Report Status 01/15/2020 FINAL  Final  Blood culture (routine x 2)     Status: None   Collection Time: 01/10/20  1:28 PM   Specimen: BLOOD  Result Value Ref Range Status   Specimen Description BLOOD LAC  Final   Special Requests   Final    BOTTLES DRAWN AEROBIC AND ANAEROBIC Blood Culture adequate volume   Culture   Final    NO GROWTH 5 DAYS Performed at [facility], [address], [place], [place] [address]    Report Status 01/15/2020 FINAL  Final    RADIOLOGY:  No results found.   CODE STATUS:     Code Status Orders  (From admission, onward)         Start     Ordered   01/10/20 2310  Full code  Continuous         01/10/20 2310        Code Status History    Date Active Date Inactive Code Status Order ID Comments User Context   01/10/2020  2230 01/10/2020 2310 Full Code [id number]  [facility], DO ED   [facility] Care Planning Activity       TOTAL TIME TAKING CARE OF THIS PATIENT: 35 minutes.    Fritzi Mandes M.D  [facility]    CC: Primary care physician; Tonia Ghent, MD

## 2020-01-20 NOTE — Telephone Encounter (Signed)
Transition Care Management Follow-up Telephone Call  Date of discharge and from where: 01/20/2020, [facility]  How have you been since you were released from the hospital? Patient states that he is feeling so much better.  Any questions or concerns? No  Items Reviewed:  Did the pt receive and understand the discharge instructions provided? Yes   Medications obtained and verified? Yes   Other? No   Any new allergies since your discharge? No   Dietary orders reviewed? Yes  Do you have support at home? Yes   Home Care and Equipment/Supplies: Were home health services ordered? not applicable If so, what is the name of the agency? N/A  Has the agency set up a time to come to the patient's home? not applicable Were any new equipment or medical supplies ordered?  Yes: oxygen What is the name of the medical supply agency? Patient does not know the name of the agency  Were you able to get the supplies/equipment? No, waiting for company to give him a call  Do you have any questions related to the use of the equipment or supplies? No  Functional Questionnaire: (I = Independent and D = Dependent) ADLs: I  Bathing/Dressing- I  Meal Prep- I  Eating- I  Maintaining continence- I  Transferring/Ambulation- I  Managing Meds- I  Follow up appointments reviewed:   PCP Hospital f/u appt confirmed? Yes  Scheduled to see Dr. Damita Dunnings on 01/26/2020 @ 11:30 am .  [facility] f/u appt confirmed? N/A   Are transportation arrangements needed? No   If their condition worsens, is the pt aware to call PCP or go to the Emergency Dept.? Yes  Was the patient provided with contact information for the PCP's office or ED? Yes  Was to pt encouraged to call back with questions or concerns? Yes

## 2020-01-20 NOTE — Progress Notes (Signed)
Patient is resting in bed. Lowered oxygen to 4L [place] and O2 saturations are maintaining in the 90s. He has no distress or concerns. Getting prepared to get patient discharged home. Bed in low position and call bell in reach.

## 2020-01-21 ENCOUNTER — Telehealth: Payer: Self-pay

## 2020-01-21 MED ORDER — FAMOTIDINE 20 MG PO TABS: 20.0000 mg | ORAL_TABLET | Freq: Every day | ORAL | Status: DC | PRN

## 2020-01-21 NOTE — Telephone Encounter (Signed)
He can try OTC pepcid 20mg  daily as needed.  Update [place] as needed.  Thanks.

## 2020-01-21 NOTE — Telephone Encounter (Signed)
Pt called the triage line saying he is having terrible indigestion since leaving the hospital with Covid. He has tried Tums without relief. Bothering his breathing. Asking what he could take.  He uses [facility]. [phone]

## 2020-01-22 NOTE — Telephone Encounter (Signed)
Called and left voicemail for patient to return call.

## 2020-01-22 NOTE — Telephone Encounter (Signed)
Spoke with patient regarding Dr. Josefine Class recommendations. Patient verbalized understanding.

## 2020-01-25 ENCOUNTER — Telehealth: Payer: Self-pay | Admitting: Family Medicine

## 2020-01-25 NOTE — Telephone Encounter (Signed)
Steven Hall called in needing verbal order for nurses to come out to the home due to he refused therapy but he wants the nurses to come to the home.   ORDER 1w1 2w2 1w6 2prn

## 2020-01-26 ENCOUNTER — Telehealth (INDEPENDENT_AMBULATORY_CARE_PROVIDER_SITE_OTHER): Payer: Medicare Other | Admitting: Family Medicine

## 2020-01-26 ENCOUNTER — Other Ambulatory Visit: Payer: Self-pay

## 2020-01-26 VITALS — Ht 67.0 in | Wt 150.0 lb

## 2020-01-26 DIAGNOSIS — E119 Type 2 diabetes mellitus without complications: Secondary | ICD-10-CM

## 2020-01-26 DIAGNOSIS — U071 COVID-19: Secondary | ICD-10-CM

## 2020-01-26 NOTE — Telephone Encounter (Signed)
Gave Verbal orders to Cecille Rubin to go forth with order for 1w1 2w2 1w6 2 prn

## 2020-01-26 NOTE — Progress Notes (Signed)
Interactive audio and video telecommunications were attempted between this provider and patient, however failed, due to patient having technical difficulties OR patient did not have access to video capability.  We continued and completed visit with audio only.   Virtual Visit via Telephone Note  I connected with patient on 01/26/20  at 11:59 AM  by telephone and verified that I am speaking with the correct person using two identifiers.  Location of patient: home.   Location of MD: [facility] Steven of referring provider (if blank then none associated): Names per persons and role in encounter:  MD: Earlyne Iba, Patient: Steven Hall.    I discussed the limitations, risks, security and privacy concerns of performing an evaluation and management service by telephone and the availability of in person appointments. I also discussed with the patient that there may be a patient responsible charge related to this service. The patient expressed understanding and agreed to proceed.  CC: inpatient follow up for covid.    History of Present Illness: No previous vaccination for COVID.  He developed symptoms, tested positive, and required inpatient treatment.  Inpatient course discussed with patient.  He improved enough to where he can be discharged.  He still has a cough that is minimally productive.  Chest is sore from cough.  Pain with deep breath that not getting better yet but he is improving otherwise  He is still on 2L O2 at baseline.  He sets drop to upper 80s on RA.  He has IS and is going that daily.  Done with abx in the meantime.   He has used combivent in the meantime.  No wheeze.  He is taking mucinex.   He is about 1 month from sx onset with covid.  D/w pt.  He is out of quarantine.    He is still caring for his wife at home.  Discussed  Observations/Objective: No apparent distress Speech normal  Assessment and Plan: Covid.  Continue supplemental oxygen.  He is out of  quarantine.  Continue with incentive spirometry and supportive care with rest and fluids.  Continue Combivent and Mucinex in the meantime.  I expect the soreness in his chest that happens with a cough to gradually improve.  If it is getting worse he will let me know.  Overall he is still improving.  He needs labs and CXR set up for next week.   I will see if patient can get flu shot at the lab visit vs later.  D/w pt trying tylenol for the chest wall pain.    Routine vaccination discussed with patient.  He'll need DM2 f/u later this fall.  Discussed with patient  Follow Up Instructions: see Hall.    I discussed the assessment and treatment plan with the patient. The patient was provided an opportunity to ask questions and all were answered. The patient agreed with the plan and demonstrated an understanding of the instructions.   The patient was advised to call back or seek an in-person evaluation if the symptoms worsen or if the condition fails to improve as anticipated.  I provided 16 minutes of non-face-to-face time during this encounter.  Elsie Stain, MD

## 2020-01-31 NOTE — Assessment & Plan Note (Signed)
Continue supplemental oxygen.  He is out of quarantine.  Continue with incentive spirometry and supportive care with rest and fluids.  Continue Combivent and Mucinex in the meantime.  I expect the soreness in his chest that happens with a cough to gradually improve.  If it is getting worse he will let me know.  Overall he is still improving.  He needs labs and CXR set up for next week.   I will see if patient can get flu shot at the lab visit vs later.  D/w pt trying tylenol for the chest wall pain.

## 2020-01-31 NOTE — Assessment & Plan Note (Signed)
He'll need DM2 f/u later this fall.  Discussed with patient

## 2020-02-02 ENCOUNTER — Ambulatory Visit (INDEPENDENT_AMBULATORY_CARE_PROVIDER_SITE_OTHER)
Admission: RE | Admit: 2020-02-02 | Discharge: 2020-02-02 | Disposition: A | Discharge status: Home / Self Care | Payer: Medicare Other | Source: Ambulatory Visit | Attending: Family Medicine | Admitting: Family Medicine

## 2020-02-02 ENCOUNTER — Other Ambulatory Visit (INDEPENDENT_AMBULATORY_CARE_PROVIDER_SITE_OTHER): Payer: Medicare Other

## 2020-02-02 ENCOUNTER — Other Ambulatory Visit: Payer: Self-pay

## 2020-02-02 DIAGNOSIS — U071 COVID-19: Secondary | ICD-10-CM

## 2020-02-02 LAB — CBC WITH DIFFERENTIAL/PLATELET
Basophils Absolute: 0.1 10*3/uL (ref 0.0–0.1)
Basophils Relative: 0.7 % (ref 0.0–3.0)
Eosinophils Absolute: 0.2 10*3/uL (ref 0.0–0.7)
Eosinophils Relative: 2.3 % (ref 0.0–5.0)
HCT: 38.6 % — ABNORMAL LOW (ref 39.0–52.0)
Hemoglobin: 13.3 g/dL (ref 13.0–17.0)
Lymphocytes Relative: 16.6 % (ref 12.0–46.0)
Lymphs Abs: 1.3 10*3/uL (ref 0.7–4.0)
MCHC: 34.4 g/dL (ref 30.0–36.0)
MCV: 89.5 fl (ref 78.0–100.0)
Monocytes Absolute: 0.6 10*3/uL (ref 0.1–1.0)
Monocytes Relative: 8.2 % (ref 3.0–12.0)
Neutro Abs: 5.7 10*3/uL (ref 1.4–7.7)
Neutrophils Relative %: 72.2 % (ref 43.0–77.0)
Platelets: 149 10*3/uL — ABNORMAL LOW (ref 150.0–400.0)
RBC: 4.32 Mil/uL (ref 4.22–5.81)
RDW: 13.8 % (ref 11.5–15.5)
WBC: 7.8 10*3/uL (ref 4.0–10.5)

## 2020-02-02 LAB — COMPREHENSIVE METABOLIC PANEL
ALT: 12 U/L (ref 0–53)
AST: 16 U/L (ref 0–37)
Albumin: 3.3 g/dL — ABNORMAL LOW (ref 3.5–5.2)
Alkaline Phosphatase: 60 U/L (ref 39–117)
BUN: 12 mg/dL (ref 6–23)
CO2: 30 mEq/L (ref 19–32)
Calcium: 8.4 mg/dL (ref 8.4–10.5)
Chloride: 102 mEq/L (ref 96–112)
Creatinine, Ser: 1.15 mg/dL (ref 0.40–1.50)
GFR: 60.9 mL/min (ref >60.00)
Glucose, Bld: 256 mg/dL — ABNORMAL HIGH (ref 70–99)
Potassium: 4 mEq/L (ref 3.5–5.1)
Sodium: 137 mEq/L (ref 135–145)
Total Bilirubin: 0.4 mg/dL (ref 0.2–1.2)
Total Protein: 6.5 g/dL (ref 6.0–8.3)

## 2020-02-05 ENCOUNTER — Other Ambulatory Visit: Payer: Self-pay | Admitting: Family Medicine

## 2020-02-05 NOTE — Telephone Encounter (Signed)
Pharmacy requests refill on: Atorvastatin 10 mg   LAST REFILL: 12/23/2018 (Q-90, R-3) LAST OV: 12/23/2018 NEXT OV: 05/05/2020 PHARMACY: [place], [place]

## 2020-02-09 NOTE — Telephone Encounter (Signed)
He may not need it now, but his wife may.  I am not her PCP.  I will route this to Anisha to get details about his wife's situation currently.  I thank all involved.

## 2020-02-09 NOTE — Telephone Encounter (Signed)
Apologies, just seeing this message, it was imbedded within all my messages.   Patients who are truly homebound are those who qualify for [facility] - that is determined when the [facility] agency/nurse does their assessment.   Were you still considering HH for this patient? Available HH agencies that may be able to take him depending on availability of resources: [facility]), [facility], [facility] and [facility].

## 2020-02-09 NOTE — Telephone Encounter (Signed)
Called and spoke with patient who stated that he is doing better even though he is still on oxygen. Patient states that his lungs is still a little sore, especially when he goes outside. Patient stated that his wife is doing okay and they are able to get out of the house and complete daily activities. Patient stated that he feels like him and his wife do not need home health at this time.

## 2020-02-10 NOTE — Telephone Encounter (Signed)
Noted. Thanks.

## 2020-02-11 ENCOUNTER — Telehealth: Payer: Self-pay

## 2020-02-11 NOTE — Telephone Encounter (Signed)
Patient called back and was scheduled for a flu shot.

## 2020-02-11 NOTE — Telephone Encounter (Signed)
Dr Damita Dunnings sent me and Terri a staff message a week ago that the pt needed labs and xray. He was asking if the pt could get a flu shot that day. I did not see this message until now.   I left pt a message to see if he has gotten his flu shot. If not, please offer him an appointment to get one. Thanks.

## 2020-03-03 ENCOUNTER — Encounter: Payer: Self-pay | Admitting: Family Medicine

## 2020-03-03 ENCOUNTER — Ambulatory Visit (INDEPENDENT_AMBULATORY_CARE_PROVIDER_SITE_OTHER): Payer: Medicare Other

## 2020-03-03 DIAGNOSIS — Z23 Encounter for immunization: Secondary | ICD-10-CM | POA: Diagnosis not present

## 2020-03-14 ENCOUNTER — Other Ambulatory Visit: Payer: Self-pay | Admitting: Family Medicine

## 2020-03-15 ENCOUNTER — Telehealth: Payer: Self-pay | Admitting: Family Medicine

## 2020-03-15 NOTE — Telephone Encounter (Signed)
Pt is requesting a call to discuss the medications on his medication list. He states he is no longer taking some of the medications listed. He also states he has a question regarding his oxygen. He can be reached at [phone].

## 2020-03-16 NOTE — Addendum Note (Signed)
Addended by: Darlina Rumpf A on: 03/16/2020 04:32 PM   Modules accepted: Orders

## 2020-03-16 NOTE — Telephone Encounter (Signed)
Spoke with pt and he stated that he is no longer on Meloxicam so I removed it from his medication list and also he would like to know when he will be able to come off his oxygen.  Please Advise

## 2020-03-16 NOTE — Telephone Encounter (Addendum)
There is no way to tell when he will be able to come off oxygen.  It really depends on his progress from this point forward.  If his pulse ox is below 92% on room air, then I would continue using oxygen as it is.  He is scheduled for follow-up in early March.  If he is doing worse in the meantime or if he wants to be evaluated sooner that I think it makes sense to get him scheduled before then.  Thanks.

## 2020-03-16 NOTE — Telephone Encounter (Signed)
LVM for pt to return cb to the office in regards to his concerns with oxygen and an=bout some medication that he is not currently taken.  Steven Hall

## 2020-03-17 NOTE — Telephone Encounter (Signed)
Spoke with pt and gave him your recommendations and he stated that his ox stays between 92/95% and then when he gets up moving around then it drops. He stated that he will continue with the ox until his F/U appt in March.  Please Advise

## 2020-03-18 NOTE — Telephone Encounter (Signed)
Patient aware to continue ox and will call back with update if any problems before march appt

## 2020-03-18 NOTE — Telephone Encounter (Signed)
Agreed.  Would continue oxygen in the meantime.  Please update me as needed.  Thanks.

## 2020-04-18 NOTE — Telephone Encounter (Addendum)
Pt left v/m requesting cb; pt has question about oxygen. Left v/m requesting pt to cb [facility].  Pt called back and said oxygen company called pt and asked pt if he was using oxygen and pt advised no. I reviewed the note with pt from 03/17/20 and 03/18/20 and pt said he would call oxygen co and let them know that his oxygen does go down when he is up moving around and will wait to turn in oxygen after seeing Dr Damita Dunnings at scheduled appt on 05/05/20.  Pt said nothing further needed at this time. Sending note to DR Damita Dunnings as Juluis Rainier.

## 2020-04-19 NOTE — Telephone Encounter (Signed)
If he isn't using/needing supplemental O2 at this point, then I'll defer to him.  Thanks

## 2020-05-05 ENCOUNTER — Other Ambulatory Visit: Payer: Self-pay | Admitting: Family Medicine

## 2020-05-05 ENCOUNTER — Ambulatory Visit: Payer: Medicare Other | Admitting: Family Medicine

## 2020-05-05 ENCOUNTER — Other Ambulatory Visit: Payer: Self-pay

## 2020-05-05 ENCOUNTER — Encounter: Payer: Self-pay | Admitting: Family Medicine

## 2020-05-05 VITALS — BP 116/72 | HR 78 | Temp 97.5°F | Ht 67.0 in | Wt 162.0 lb

## 2020-05-05 DIAGNOSIS — U071 COVID-19: Secondary | ICD-10-CM

## 2020-05-05 DIAGNOSIS — Z638 Other specified problems related to primary support group: Secondary | ICD-10-CM

## 2020-05-05 DIAGNOSIS — E119 Type 2 diabetes mellitus without complications: Secondary | ICD-10-CM

## 2020-05-05 LAB — POCT GLYCOSYLATED HEMOGLOBIN (HGB A1C): Hemoglobin A1C: 5.8 % — AB (ref 4.0–5.6)

## 2020-05-05 NOTE — Patient Instructions (Addendum)
Your sugar test was a lot better.  Plan on recheck in about 6 months with labs ahead of a physical.  Take care.  Glad to see you.

## 2020-05-05 NOTE — Progress Notes (Signed)
This visit occurred during the SARS-CoV-2 public health emergency.  Safety protocols were in place, including screening questions prior to the visit, additional usage of staff PPE, and extensive cleaning of exam room while observing appropriate contact time as indicated for disinfecting solutions.  Diabetes:  No meds.   Hypoglycemic episodes: no  Hyperglycemic episodes: no  Feet problems: tingling in the feet since he had covid, that is some better.  Blood Sugars averaging: not checked.   eye exam within last year: d/w pt.  He'll call about that when he can.   A1c clearly better.  Down to 5.8 from 7.2.  He is working on cutting back on sweets.    Wife has dementia, had covid, had heart surgery.  Sig stressor for patient.    He is still getting better from covid but not back to normal yet.  He can walk around the grocery store but is tired afterward.  He prev had sig L sided chest pain but that resolved.  R sided sx are getting better.  Off inhalers, no extra O2 use.    He needs a note sent to oxygen supplier about getting tanks picked up.  I told him we would work on this.  Meds, vitals, and allergies reviewed.   ROS: Per HPI unless specifically indicated in ROS section   GEN: nad, alert and oriented HEENT: ncat NECK: supple w/o LA CV: rrr. PULM: ctab, no inc wob ABD: soft, +bs EXT: no edema SKIN: Well-perfused.  Diabetic foot exam: Normal inspection No skin breakdown No calluses  Normal DP pulses Normal sensation to light touch and monofilament Nails normal

## 2020-05-07 DIAGNOSIS — Z638 Other specified problems related to primary support group: Secondary | ICD-10-CM | POA: Insufficient documentation

## 2020-05-07 NOTE — Assessment & Plan Note (Signed)
History of.  Now off supplemental O2.  We will check on getting his oxygen tanks picked up.  He is gradually improving.

## 2020-05-07 NOTE — Assessment & Plan Note (Signed)
Discussed, support offered.  He will update me as needed.

## 2020-05-07 NOTE — Assessment & Plan Note (Signed)
History of diabetes, A1c much improved. Plan on recheck in about 6 months with labs ahead of a physical.  He agrees with plan.  Continue as is with diet.

## 2021-03-08 ENCOUNTER — Telehealth: Payer: Self-pay | Admitting: Family Medicine

## 2021-03-08 ENCOUNTER — Other Ambulatory Visit: Payer: Self-pay | Admitting: Family Medicine

## 2021-03-08 NOTE — Telephone Encounter (Signed)
LVM for pt to rtn my call to schedule AWV with [facility].  

## 2021-03-10 NOTE — Telephone Encounter (Signed)
Needs AWV scheduled with provider in March 2023

## 2021-03-13 NOTE — Telephone Encounter (Signed)
LMTCB to schedule AWV with PCP in march

## 2021-04-22 ENCOUNTER — Other Ambulatory Visit: Payer: Self-pay | Admitting: Family Medicine

## 2021-04-22 DIAGNOSIS — E782 Mixed hyperlipidemia: Secondary | ICD-10-CM

## 2021-04-22 DIAGNOSIS — R739 Hyperglycemia, unspecified: Secondary | ICD-10-CM

## 2021-04-27 ENCOUNTER — Other Ambulatory Visit: Payer: Self-pay

## 2021-04-27 ENCOUNTER — Other Ambulatory Visit (INDEPENDENT_AMBULATORY_CARE_PROVIDER_SITE_OTHER): Payer: Medicare Other

## 2021-04-27 DIAGNOSIS — R739 Hyperglycemia, unspecified: Secondary | ICD-10-CM

## 2021-04-27 DIAGNOSIS — E782 Mixed hyperlipidemia: Secondary | ICD-10-CM

## 2021-04-27 LAB — COMPREHENSIVE METABOLIC PANEL
ALT: 13 U/L (ref 0–53)
AST: 19 U/L (ref 0–37)
Albumin: 4.1 g/dL (ref 3.5–5.2)
Alkaline Phosphatase: 61 U/L (ref 39–117)
BUN: 24 mg/dL — ABNORMAL HIGH (ref 6–23)
CO2: 30 mEq/L (ref 19–32)
Calcium: 9.2 mg/dL (ref 8.4–10.5)
Chloride: 103 mEq/L (ref 96–112)
Creatinine, Ser: 1.44 mg/dL (ref 0.40–1.50)
GFR: 46.1 mL/min — ABNORMAL LOW (ref >60.00)
Glucose, Bld: 170 mg/dL — ABNORMAL HIGH (ref 70–99)
Potassium: 4.8 mEq/L (ref 3.5–5.1)
Sodium: 137 mEq/L (ref 135–145)
Total Bilirubin: 0.5 mg/dL (ref 0.2–1.2)
Total Protein: 6.8 g/dL (ref 6.0–8.3)

## 2021-04-27 LAB — LIPID PANEL
Cholesterol: 183 mg/dL (ref 0–200)
HDL: 47.7 mg/dL (ref >39.00)
LDL Cholesterol: 112 mg/dL — ABNORMAL HIGH (ref 0–99)
NonHDL: 135.1
Total CHOL/HDL Ratio: 4
Triglycerides: 117 mg/dL (ref 0.0–149.0)
VLDL: 23.4 mg/dL (ref 0.0–40.0)

## 2021-04-27 LAB — HEMOGLOBIN A1C: Hgb A1c MFr Bld: 7.6 % — ABNORMAL HIGH (ref 4.6–6.5)

## 2021-05-01 ENCOUNTER — Other Ambulatory Visit: Payer: Self-pay | Admitting: Family Medicine

## 2021-05-05 ENCOUNTER — Encounter: Payer: Self-pay | Admitting: Family Medicine

## 2021-05-05 ENCOUNTER — Other Ambulatory Visit: Payer: Self-pay

## 2021-05-05 ENCOUNTER — Ambulatory Visit (INDEPENDENT_AMBULATORY_CARE_PROVIDER_SITE_OTHER): Payer: Medicare Other | Admitting: Family Medicine

## 2021-05-05 VITALS — BP 138/82 | HR 65 | Temp 97.7°F | Ht 67.0 in | Wt 168.0 lb

## 2021-05-05 DIAGNOSIS — E119 Type 2 diabetes mellitus without complications: Secondary | ICD-10-CM | POA: Diagnosis not present

## 2021-05-05 DIAGNOSIS — Z0000 Encounter for general adult medical examination without abnormal findings: Secondary | ICD-10-CM | POA: Diagnosis not present

## 2021-05-05 DIAGNOSIS — E782 Mixed hyperlipidemia: Secondary | ICD-10-CM | POA: Diagnosis not present

## 2021-05-05 DIAGNOSIS — Z7189 Other specified counseling: Secondary | ICD-10-CM

## 2021-05-05 MED ORDER — ATORVASTATIN CALCIUM 10 MG PO TABS: ORAL_TABLET | ORAL | 3 refills | Status: DC

## 2021-05-05 MED ORDER — FINASTERIDE 5 MG PO TABS: 5.0000 mg | ORAL_TABLET | Freq: Every day | ORAL | 3 refills | Status: DC

## 2021-05-05 MED ORDER — TAMSULOSIN HCL 0.4 MG PO CAPS: 0.4000 mg | ORAL_CAPSULE | Freq: Every day | ORAL | 3 refills | Status: DC

## 2021-05-05 NOTE — Progress Notes (Signed)
This visit occurred during the SARS-CoV-2 public health emergency.  Safety protocols were in place, including screening questions prior to the visit, additional usage of staff PPE, and extensive cleaning of exam room while observing appropriate contact time as indicated for disinfecting solutions. ? ?I have personally reviewed the Medicare Annual Wellness questionnaire and have noted ?1. The patient's medical and social history ?2. Their use of alcohol, tobacco or illicit drugs ?3. Their current medications and supplements ?4. The patient's functional ability including ADL's, fall risks, home safety risks and hearing or visual ?            impairment. ?5. Diet and physical activities ?6. Evidence for depression or mood disorders ? ?The patients weight, height, BMI have been recorded in the chart and visual acuity is per eye clinic.  ?I have made referrals, counseling and provided education to the patient based review of the above and I have provided the pt with a written personalized care plan for preventive services. ? ?Provider list updated- see scanned forms.  Routine anticipatory guidance given to patient.  See health maintenance. The possibility exists that previously documented standard health maintenance information may have been brought forward from a previous encounter into this note.  If needed, that same information has been updated to reflect the current situation based on today's encounter.   ? ?Flu discussed with patient. ?Shingles discussed with patient ?PNA previously done ?Tetanus 2013 ?Covid vaccine prev done.   ?Colon screening not due given his age. ?Prostate cancer screening deferred  ?Advance directive- Son designated if patient were incapacitated.  ?Cognitive function addressed- see scanned forms- and if abnormal then additional documentation follows.  ? ?In addition to [facility], follow up visit for the below conditions: ? ?Diabetes:  ?No meds.   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   ?Blood Sugars averaging: not checked  ?eye exam within last year: yes ? ?Cr elevation d/w pt.   ? 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  Voltaren gel didn't help.   ?Diet compliance: yes ?Exercise: yes ?Labs d/w pt.  ? ?I didn't know until today that his wife had passed.  D/w pt.  Condolences offered.   ? ?PMH and SH reviewed ? ?Meds, vitals, and allergies reviewed.  ? ?ROS: Per HPI.  Unless specifically indicated otherwise in HPI, the patient denies: ? ?General: fever. ?Eyes: acute vision changes ?ENT: sore throat ?Cardiovascular: chest pain ?Respiratory: SOB ?GI: vomiting ?GU: dysuria ?Musculoskeletal: acute back pain ?Derm: acute rash ?Neuro: acute motor dysfunction ?Psych: worsening mood ?Endocrine: polydipsia ?Heme: bleeding ?Allergy: hayfever ? ?GEN: nad, alert and oriented ?HEENT: ncat ?NECK: supple w/o LA ?CV: rrr. ?PULM: ctab, no inc wob ?ABD: soft, +bs ?EXT: no edema ?SKIN: no acute rash ? ?Diabetic foot exam: ?Normal inspection ?No skin breakdown ?No calluses  ?Normal DP pulses ?Normal sensation to light touch and monofilament ?Nails normal ? ?

## 2021-05-05 NOTE — Patient Instructions (Signed)
Keep exercising and recheck labs in about 3 months.  Please schedule a nonfasting lab visit for around June.  ?Take care.  Glad to see you. ?

## 2021-05-10 NOTE — Assessment & Plan Note (Signed)
?  Advance directive- Son designated if patient were incapacitated.  ?

## 2021-05-10 NOTE — Assessment & Plan Note (Signed)
-  Continue Lipitor °

## 2021-05-10 NOTE — Assessment & Plan Note (Signed)
A1c up but not reasonable to start medication at this point.  A1c increased likely related to the painful from his wife passing away.  We can recheck his labs later this year.  See after visit summary.  He agrees with plan. ? ?We can also recheck his creatinine when we recheck his follow-up A1c.  Discussed with patient.  He agrees. ?

## 2021-05-10 NOTE — Assessment & Plan Note (Signed)
Flu discussed with patient. ?Shingles discussed with patient ?PNA previously done ?Tetanus 2013 ?Covid vaccine prev done.   ?Colon screening not due given his age. ?Prostate cancer screening deferred  ?Advance directive- Son designated if patient were incapacitated.  ?Cognitive function addressed- see scanned forms- and if abnormal then additional documentation follows.  ?

## 2022-01-02 IMAGING — CT CT CERVICAL SPINE W/O CM
3 of 4 series · 10 of 33 positions shown, 12 images · non-contrast
Comparison: None.

CLINICAL DATA: ZT039-Q0 positive, respiratory distress, fell

EXAM:
CT HEAD WITHOUT CONTRAST
CT MAXILLOFACIAL WITHOUT CONTRAST
CT CERVICAL SPINE WITHOUT CONTRAST
TECHNIQUE: Multidetector CT imaging of the head, cervical spine, and
maxillofacial structures were performed using the standard protocol
without intravenous contrast. Multiplanar CT image reconstructions
of the cervical spine and maxillofacial structures were also
generated.

[Series 5: orthogonal axials · axial · 0.23mm/px · z∈[-300,-216]mm · 2 of 112 slices shown, 3 images]
[im 32/112  soft-tissue]
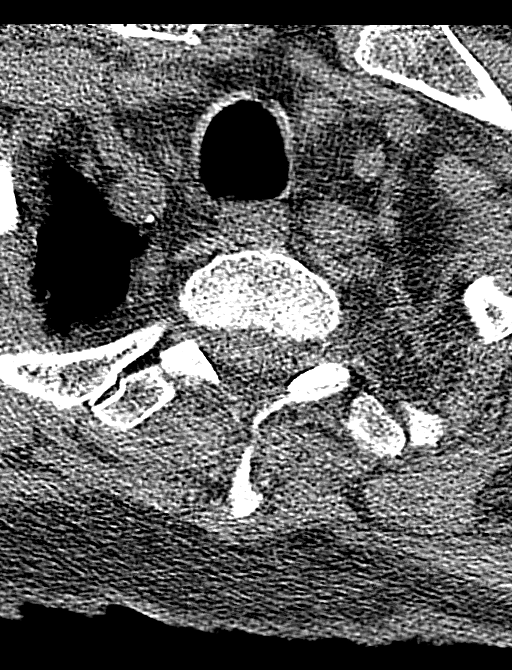
[im 32/112  bone]
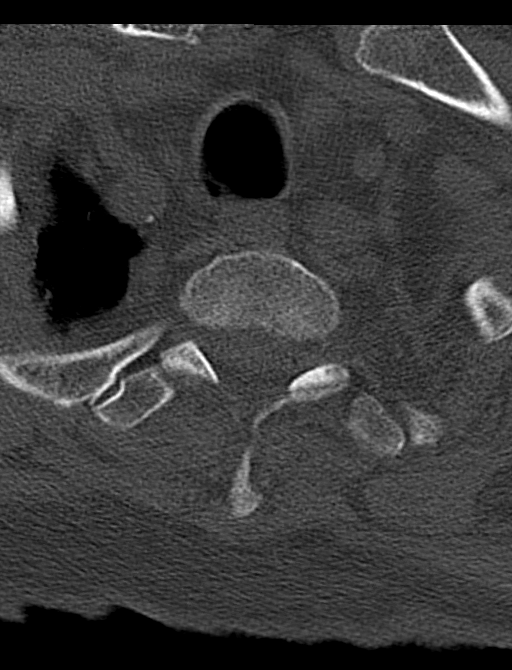
[im 80/112  bone]
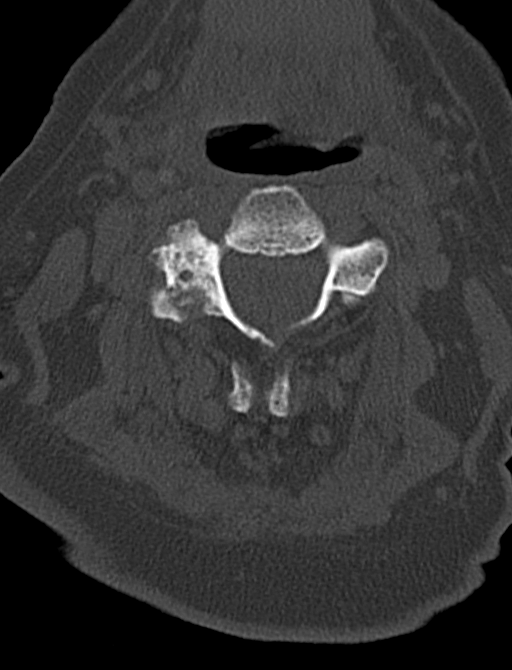

[Series 6: sagittal bone · sagittal · 0.28mm/px · 5 of 70 slices shown, 6 images]
[im 24/70  bone]
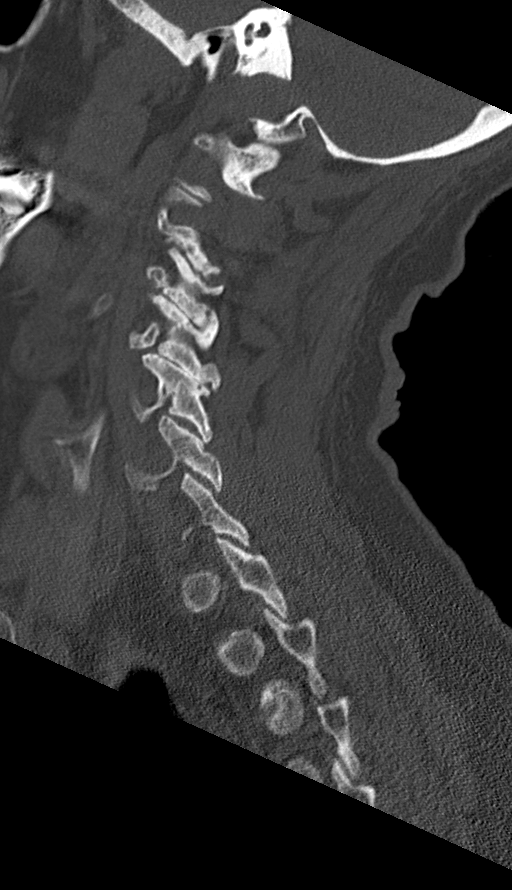
[im 29/70  bone]
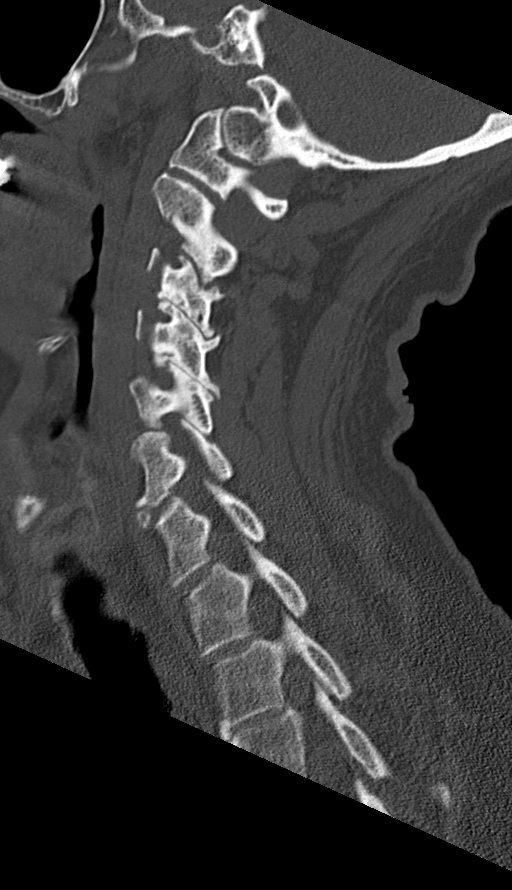
[im 35/70  soft-tissue]
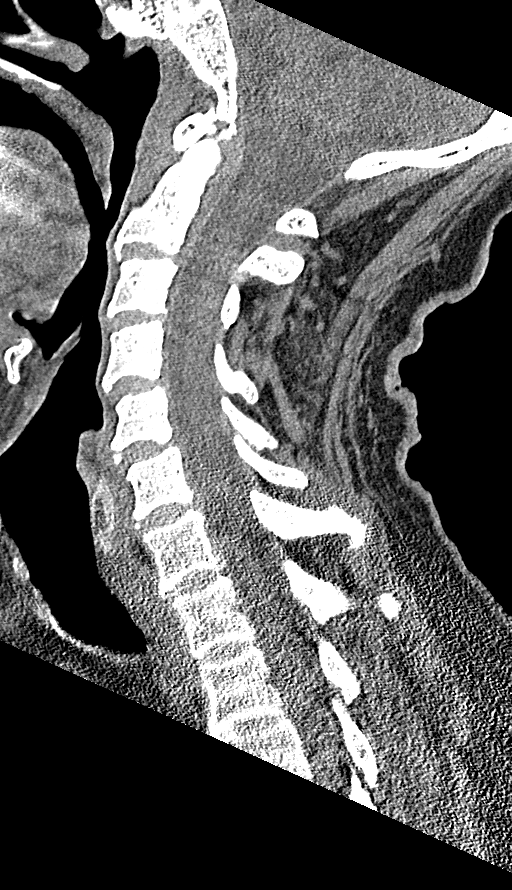
[im 35/70  bone]
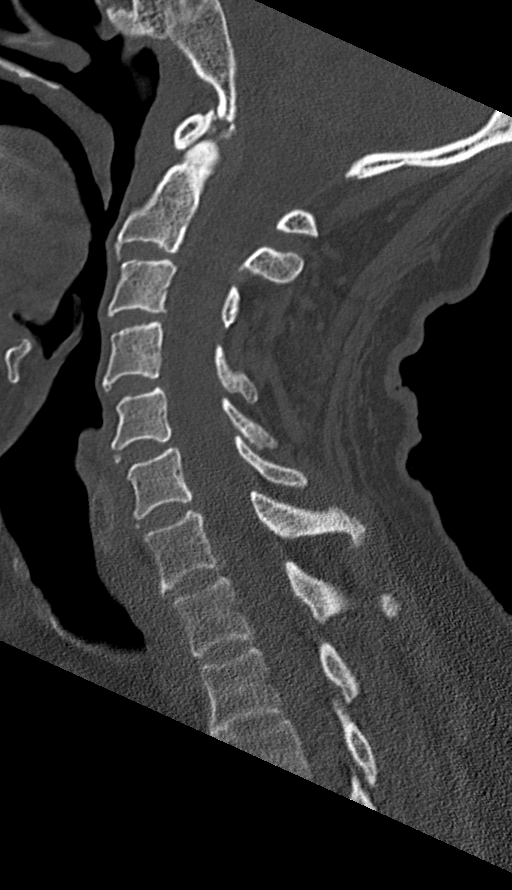
[im 41/70  bone]
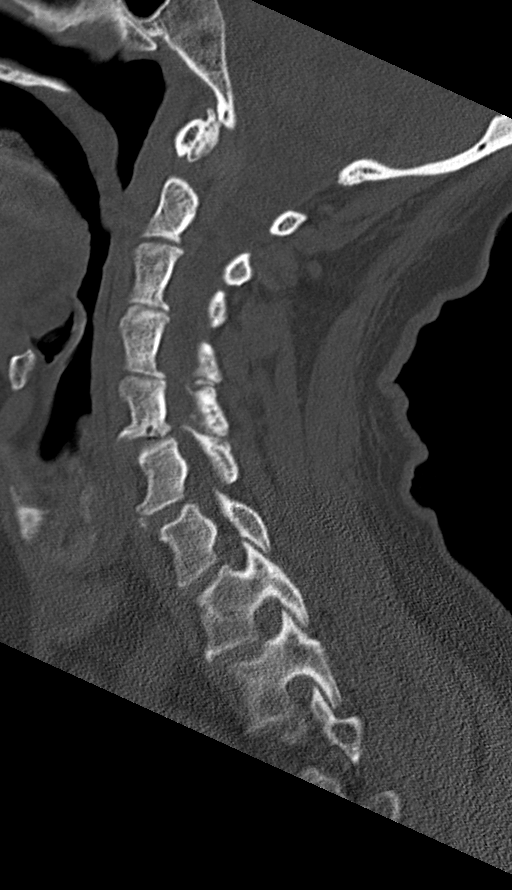
[im 47/70  bone]
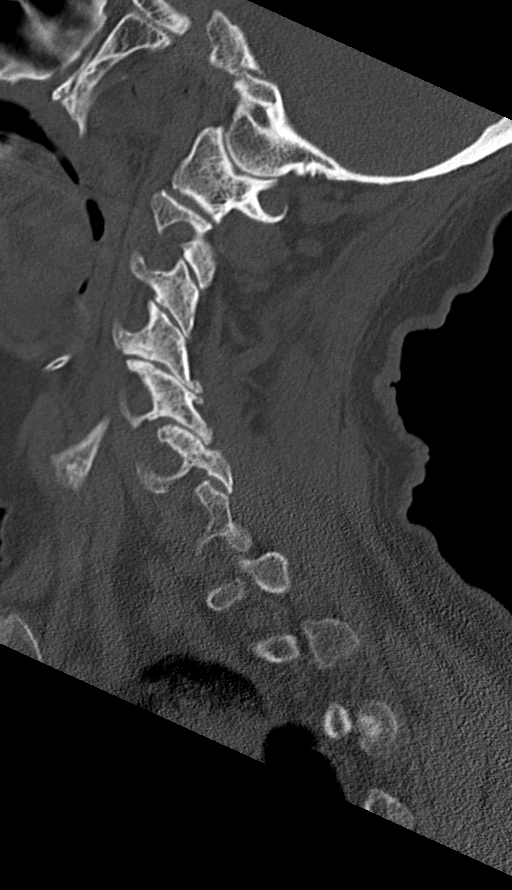

[Series 7: coronal bone · coronal · 0.28mm/px · 3 of 76 slices shown]
[im 21/76  bone]
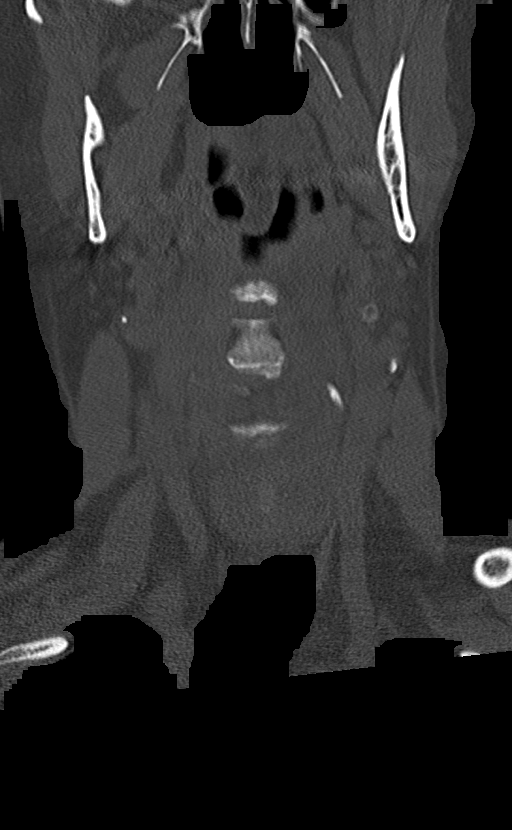
[im 32/76  bone]
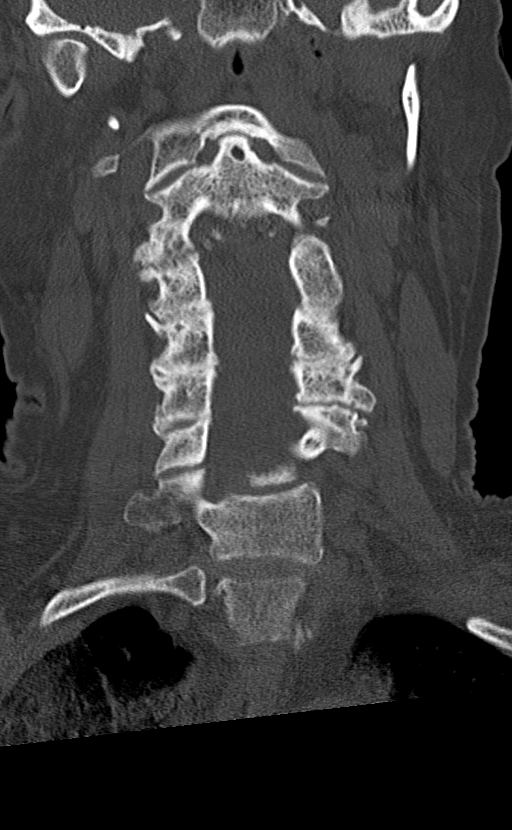
[im 44/76  bone]
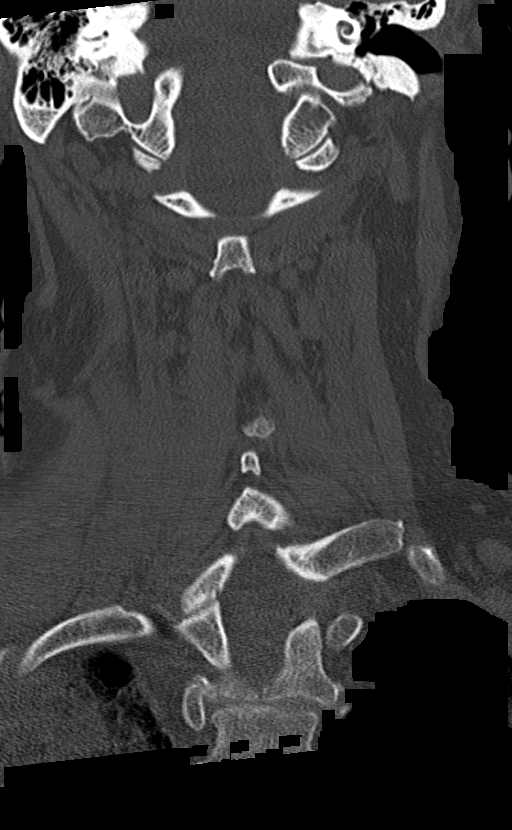

[10 of 33 positions shown; findings below may reference images not displayed]

FINDINGS: CT HEAD FINDINGS

Brain: No acute infarct or hemorrhage. Lateral ventricles and
midline structures appear unremarkable. No acute extra-axial fluid
collections. No mass effect.

Vascular: No hyperdense vessel or unexpected calcification.

Skull: Normal. Negative for fracture or focal lesion.

Other: None.

CT MAXILLOFACIAL FINDINGS

Osseous: No fracture or mandibular dislocation. No destructive
process.

Orbits: Negative. No traumatic or inflammatory finding.

Sinuses: Minimal mucosal thickening within the anterior ethmoid air
cells.

Soft tissues: Negative.

CT CERVICAL SPINE FINDINGS

Alignment: Alignment is grossly anatomic.

Skull base and vertebrae: No acute fracture. No primary bone lesion
or focal pathologic process.

Soft tissues and spinal canal: No prevertebral fluid or swelling. No
visible canal hematoma.

Disc levels: Multilevel facet hypertrophy greatest from C2 through
C5, right greater than left. This results and right greater than
left neural foraminal narrowing at C2-3, C3-4, and C4-5. Disc spaces
are relatively well preserved.

Upper chest: Airways patent. Widespread biapical ground-glass
airspace disease consistent with given history of ZT039-Q0.

Other: Reconstructed images demonstrate no additional findings.
IMPRESSION: 1. No acute intracranial process.
2. No acute facial bone fracture.  Minimal ethmoid sinus disease.
3. No acute cervical spine fracture. Multilevel cervical facet
hypertrophy as above.
4. Bilateral upper lobe airspace disease consistent with COVID 19
pneumonia.

## 2022-01-02 IMAGING — CT CT HEAD W/O CM
3 series · 14 of 47 positions shown, 16 images · non-contrast
Comparison: None.

CLINICAL DATA: ZT039-Q0 positive, respiratory distress, fell

EXAM:
CT HEAD WITHOUT CONTRAST
CT MAXILLOFACIAL WITHOUT CONTRAST
CT CERVICAL SPINE WITHOUT CONTRAST
TECHNIQUE: Multidetector CT imaging of the head, cervical spine, and
maxillofacial structures were performed using the standard protocol
without intravenous contrast. Multiplanar CT image reconstructions
of the cervical spine and maxillofacial structures were also
generated.

[Series 2: head wo · axial · 0.48mm/px · z∈[-119,+6]mm · 8 of 31 slices shown, 10 images]
[im 3/31  brain]
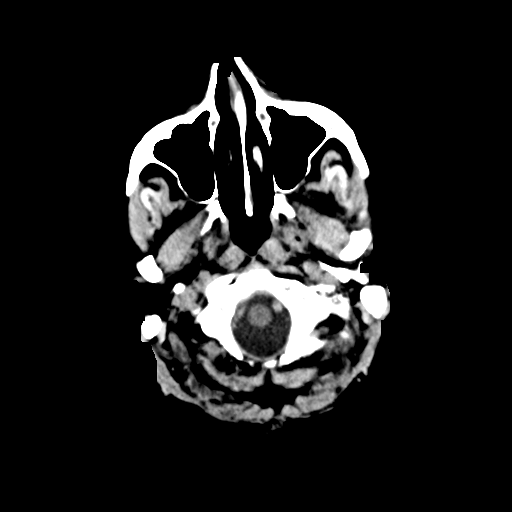
[im 3/31  bone]
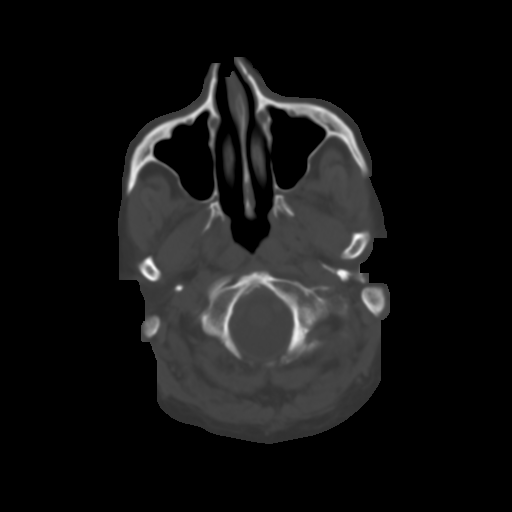
[im 7/31  brain]
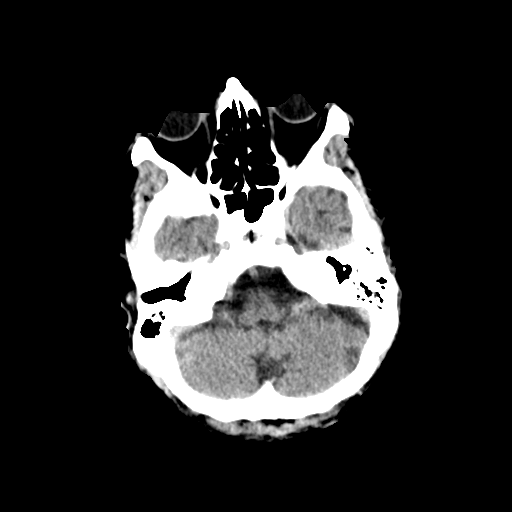
[im 10/31  brain]
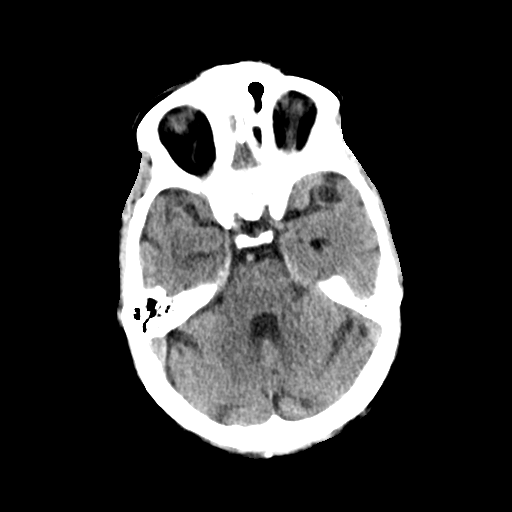
[im 14/31  brain]
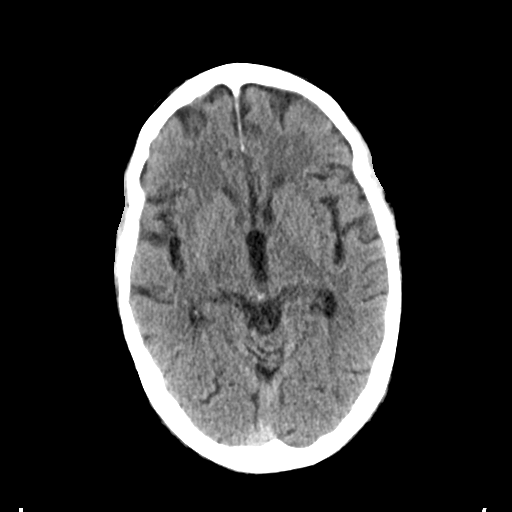
[im 17/31  brain]
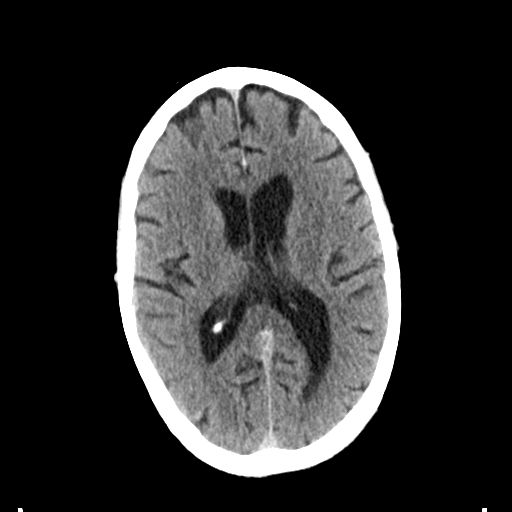
[im 17/31  bone]
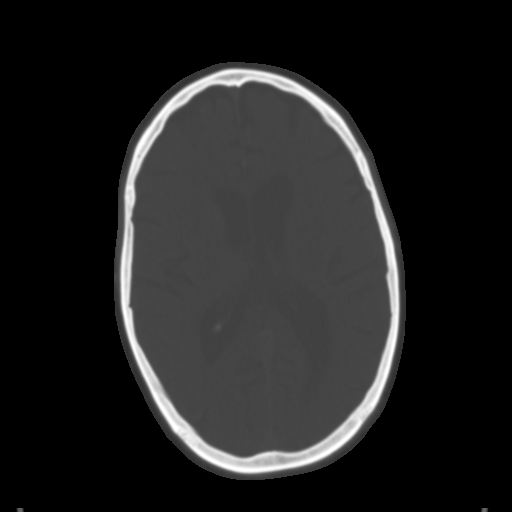
[im 21/31  brain]
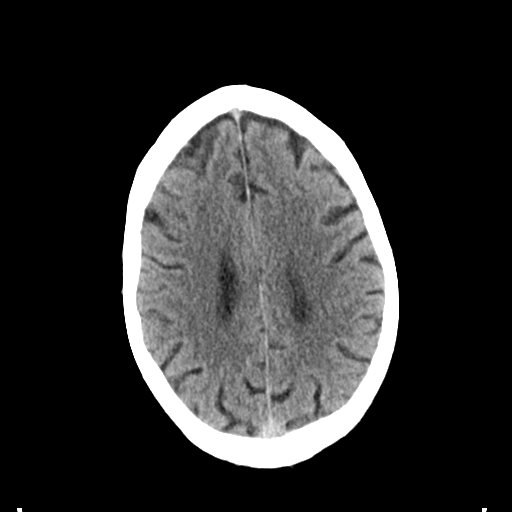
[im 24/31  brain]
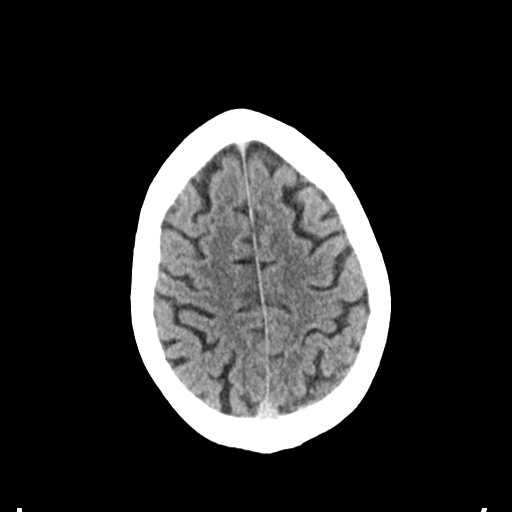
[im 28/31  brain]
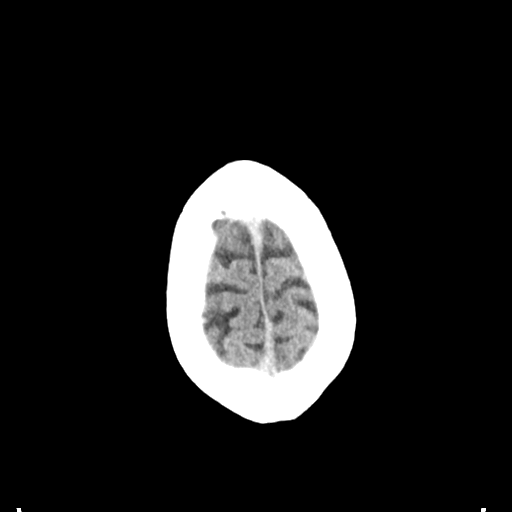

[Series 4: coronal soft tissue · coronal · 0.32mm/px · 3 of 69 slices shown]
[im 23/69  brain]
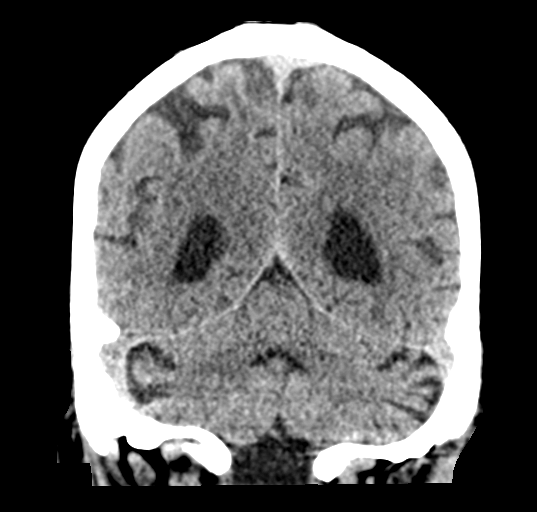
[im 31/69  brain]
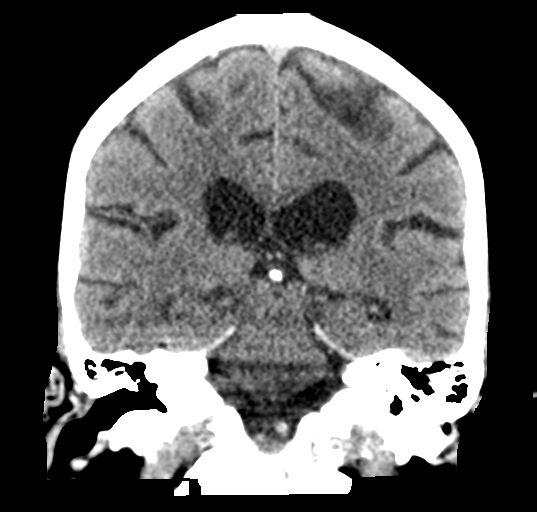
[im 38/69  brain]
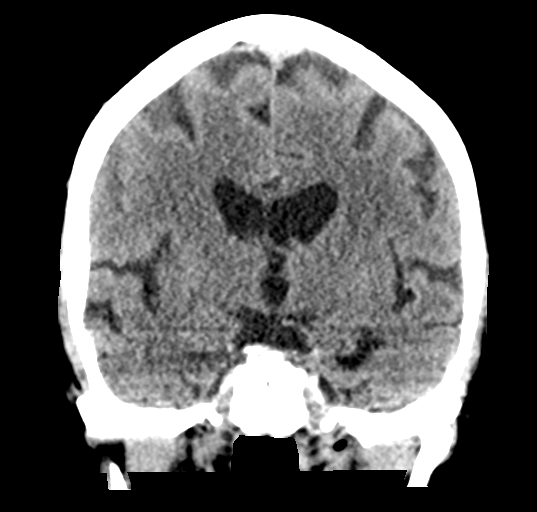

[Series 5: sagittal soft tissue · sagittal · 0.33mm/px · 3 of 53 slices shown]
[im 18/53  brain]
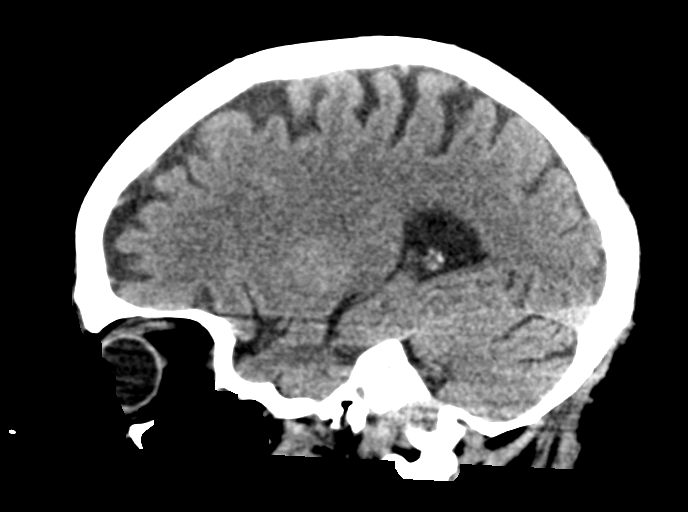
[im 27/53  brain]
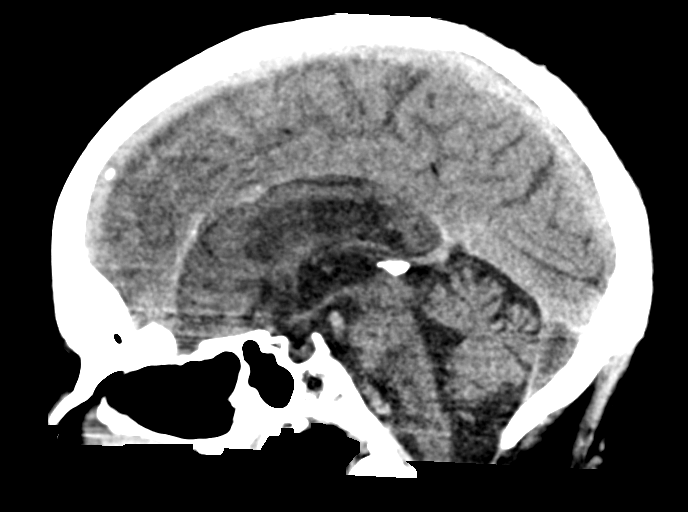
[im 35/53  brain]
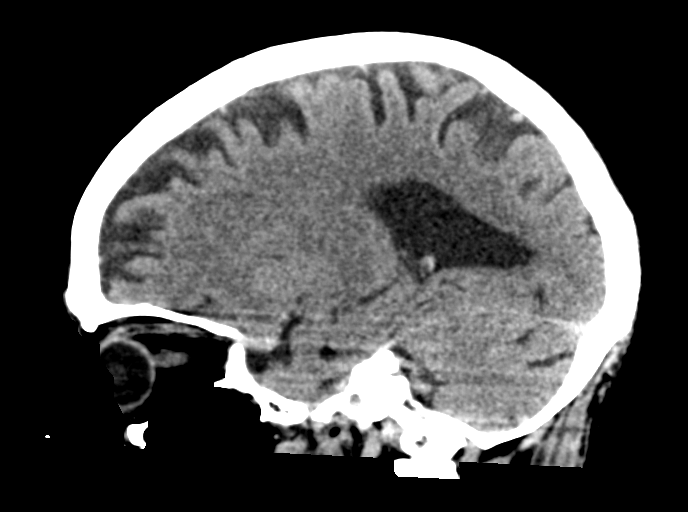

[14 of 47 positions shown; findings below may reference images not displayed]

FINDINGS: CT HEAD FINDINGS

Brain: No acute infarct or hemorrhage. Lateral ventricles and
midline structures appear unremarkable. No acute extra-axial fluid
collections. No mass effect.

Vascular: No hyperdense vessel or unexpected calcification.

Skull: Normal. Negative for fracture or focal lesion.

Other: None.

CT MAXILLOFACIAL FINDINGS

Osseous: No fracture or mandibular dislocation. No destructive
process.

Orbits: Negative. No traumatic or inflammatory finding.

Sinuses: Minimal mucosal thickening within the anterior ethmoid air
cells.

Soft tissues: Negative.

CT CERVICAL SPINE FINDINGS

Alignment: Alignment is grossly anatomic.

Skull base and vertebrae: No acute fracture. No primary bone lesion
or focal pathologic process.

Soft tissues and spinal canal: No prevertebral fluid or swelling. No
visible canal hematoma.

Disc levels: Multilevel facet hypertrophy greatest from C2 through
C5, right greater than left. This results and right greater than
left neural foraminal narrowing at C2-3, C3-4, and C4-5. Disc spaces
are relatively well preserved.

Upper chest: Airways patent. Widespread biapical ground-glass
airspace disease consistent with given history of ZT039-Q0.

Other: Reconstructed images demonstrate no additional findings.
IMPRESSION: 1. No acute intracranial process.
2. No acute facial bone fracture.  Minimal ethmoid sinus disease.
3. No acute cervical spine fracture. Multilevel cervical facet
hypertrophy as above.
4. Bilateral upper lobe airspace disease consistent with COVID 19
pneumonia.

## 2022-04-30 ENCOUNTER — Ambulatory Visit: Payer: Medicare Other | Admitting: Family Medicine

## 2022-04-30 ENCOUNTER — Encounter: Payer: Self-pay | Admitting: Family Medicine

## 2022-04-30 VITALS — BP 130/86 | HR 81 | Temp 98.2°F | Ht 67.0 in | Wt 155.0 lb

## 2022-04-30 DIAGNOSIS — R1030 Lower abdominal pain, unspecified: Secondary | ICD-10-CM | POA: Diagnosis not present

## 2022-04-30 DIAGNOSIS — E119 Type 2 diabetes mellitus without complications: Secondary | ICD-10-CM

## 2022-04-30 LAB — LIPID PANEL
Cholesterol: 177 mg/dL (ref 0–200)
HDL: 53.9 mg/dL (ref >39.00)
LDL Cholesterol: 85 mg/dL (ref 0–99)
NonHDL: 123.53
Total CHOL/HDL Ratio: 3
Triglycerides: 194 mg/dL — ABNORMAL HIGH (ref 0.0–149.0)
VLDL: 38.8 mg/dL (ref 0.0–40.0)

## 2022-04-30 LAB — COMPREHENSIVE METABOLIC PANEL
ALT: 28 U/L (ref 0–53)
AST: 28 U/L (ref 0–37)
Albumin: 4.2 g/dL (ref 3.5–5.2)
Alkaline Phosphatase: 109 U/L (ref 39–117)
BUN: 16 mg/dL (ref 6–23)
CO2: 27 mEq/L (ref 19–32)
Calcium: 9.5 mg/dL (ref 8.4–10.5)
Chloride: 96 mEq/L (ref 96–112)
Creatinine, Ser: 1.19 mg/dL (ref 0.40–1.50)
GFR: 57.54 mL/min — ABNORMAL LOW (ref >60.00)
Glucose, Bld: 330 mg/dL — ABNORMAL HIGH (ref 70–99)
Potassium: 4.7 mEq/L (ref 3.5–5.1)
Sodium: 133 mEq/L — ABNORMAL LOW (ref 135–145)
Total Bilirubin: 0.4 mg/dL (ref 0.2–1.2)
Total Protein: 7.4 g/dL (ref 6.0–8.3)

## 2022-04-30 LAB — CBC WITH DIFFERENTIAL/PLATELET
Basophils Absolute: 0 10*3/uL (ref 0.0–0.1)
Basophils Relative: 0.5 % (ref 0.0–3.0)
Eosinophils Absolute: 0.2 10*3/uL (ref 0.0–0.7)
Eosinophils Relative: 3.9 % (ref 0.0–5.0)
HCT: 49 % (ref 39.0–52.0)
Hemoglobin: 16.6 g/dL (ref 13.0–17.0)
Lymphocytes Relative: 26.3 % (ref 12.0–46.0)
Lymphs Abs: 1.6 10*3/uL (ref 0.7–4.0)
MCHC: 33.9 g/dL (ref 30.0–36.0)
MCV: 86.4 fl (ref 78.0–100.0)
Monocytes Absolute: 0.6 10*3/uL (ref 0.1–1.0)
Monocytes Relative: 9.8 % (ref 3.0–12.0)
Neutro Abs: 3.5 10*3/uL (ref 1.4–7.7)
Neutrophils Relative %: 59.5 % (ref 43.0–77.0)
Platelets: 165 10*3/uL (ref 150.0–400.0)
RBC: 5.67 Mil/uL (ref 4.22–5.81)
RDW: 13.9 % (ref 11.5–15.5)
WBC: 5.9 10*3/uL (ref 4.0–10.5)

## 2022-04-30 LAB — HEMOGLOBIN A1C: Hgb A1c MFr Bld: 11.9 % — ABNORMAL HIGH (ref 4.6–6.5)

## 2022-04-30 MED ORDER — IBUPROFEN 200 MG PO TABS: 400.0000 mg | ORAL_TABLET | Freq: Three times a day (TID) | ORAL | 0 refills | Status: AC | PRN

## 2022-04-30 NOTE — Progress Notes (Unsigned)
H/o hernia, B inguinal hernia.  Some dull pain at baseline near the medial inguinal canal, he has noted a small bulge now.    More recently with R lateral inguinal pain- less sore today.  This is new compared to prev baseline sx.  He is active at baseline.  No FCNAVD except for a URI that resolved in the meantime.  Abd pain predates the URI.  Some residual nasal congestion.  Minimal cough.  No blood in stools.    History of diabetes.  No meds currently.  Intentional weight loss with diet and exercise over the last year.  Discussed that he has a history of diabetes.  He was advised to follow-up last year.  He appeared not to believe me when I told him and clear in plain language that he had a history of diabetes.  Still on statin at baseline with lipids pending.  He has been taking ibuprofen '400mg'$  qhs prn for pain, for inguinal pain and joint pain (esp hand pain, shoulder pain).    Meds, vitals, and allergies reviewed.   ROS: Per HPI unless specifically indicated in ROS section   Nad Ncat Neck supple, no LA Rrr Ctab Abd soft, not ttp Likely small B inguinal hernias without incarceration. Abdomen not tender otherwise. Normal abdominal wall inspection. Extremities without edema.  Labs pending.

## 2022-04-30 NOTE — Patient Instructions (Signed)
Go to the lab on the way out.   If you have mychart we'll likely use that to update you.    Take care.  Glad to see you. I would take ibuprofen with food as needed for now.  Let me know if the pain doesn't continue to get better.

## 2022-05-01 ENCOUNTER — Telehealth: Payer: Self-pay | Admitting: Family Medicine

## 2022-05-01 NOTE — Telephone Encounter (Signed)
Called patient to schedule Medicare Annual Wellness Visit (AWV). Left message for patient to call back and schedule Medicare Annual Wellness Visit (AWV).  Last date of AWV: 05/05/2021  Please schedule an appointment at any time with [facility].  If any questions, please contact me at [phone].  Thank you ,  [place] Direct Dial: [phone]

## 2022-05-02 NOTE — Assessment & Plan Note (Signed)
Some better in the meantime. Discussed options. I would take ibuprofen with food as needed for now.  He can let me know if the pain doesn't continue to get better.  It does not appear that he needs to see general surgery at this point.

## 2022-05-02 NOTE — Assessment & Plan Note (Signed)
See notes on labs. 

## 2022-05-07 ENCOUNTER — Encounter: Payer: Self-pay | Admitting: Family Medicine

## 2022-05-07 ENCOUNTER — Ambulatory Visit: Payer: Medicare Other | Admitting: Family Medicine

## 2022-05-07 VITALS — BP 122/70 | HR 84 | Temp 97.7°F | Ht 67.0 in | Wt 155.0 lb

## 2022-05-07 DIAGNOSIS — E119 Type 2 diabetes mellitus without complications: Secondary | ICD-10-CM | POA: Diagnosis not present

## 2022-05-07 DIAGNOSIS — R109 Unspecified abdominal pain: Secondary | ICD-10-CM | POA: Diagnosis not present

## 2022-05-07 MED ORDER — LANTUS SOLOSTAR 100 UNIT/ML ~~LOC~~ SOPN: 5.0000 [IU] | PEN_INJECTOR | Freq: Every day | SUBCUTANEOUS | 99 refills | Status: DC

## 2022-05-07 MED ORDER — BLOOD GLUCOSE MONITORING SUPPL DEVI: 0 refills | Status: AC

## 2022-05-07 MED ORDER — LANCETS MISC. MISC: 3 refills | Status: AC

## 2022-05-07 MED ORDER — BLOOD GLUCOSE TEST VI STRP: ORAL_STRIP | 3 refills | Status: DC

## 2022-05-07 MED ORDER — PEN NEEDLES 30G X 5 MM MISC: 3 refills | Status: DC

## 2022-05-07 NOTE — Patient Instructions (Addendum)
Start with 5 units at night.  If your morning sugar is above 160, then add 1 unit to the next dose.  If 110-160, no change in dose.  If below 110, then cut back 1 unit.   Recheck A1c in about 3 months but please update me about 2 weeks after you start insulin, sooner if needed.  Take care.  Glad to see you.  If the abdominal aches is any worse then let me know.

## 2022-05-07 NOTE — Progress Notes (Signed)
DM2.  Labs and tx options dw pt.  Discussed limiting carbs.  He can use an insulin pen and he knows how to use a meter.  D/w pt about options.  See AVS.    RLQ noted more when/after eating, but some better in the last week.  No blood in stool.  No vomiting.  We talked about options going forward.  Meds, vitals, and allergies reviewed.   ROS: Per HPI unless specifically indicated in ROS section   GEN: nad, alert and oriented HEENT: ncat NECK: supple w/o LA CV: rrr.  PULM: ctab, no inc wob ABD: soft, +bs EXT: no edema SKIN: Well-perfused.  30 minutes were devoted to patient care in this encounter (this includes time spent reviewing the patient's file/history, interviewing and examining the patient, counseling/reviewing plan with patient).

## 2022-05-09 DIAGNOSIS — R109 Unspecified abdominal pain: Secondary | ICD-10-CM | POA: Insufficient documentation

## 2022-05-09 NOTE — Assessment & Plan Note (Signed)
Discussed options.  He is aware about how to use an insulin pen.  Start with 5 units at night.  If morning sugar is above 160, then add 1 unit to the next dose.  If 110-160, no change in dose.  If below 110, then cut back 1 unit.   He will likely gradually increase by 1 unit/day in the near future. Recheck A1c in about 3 months but I asked him to please update me about 2 weeks after starting insulin, sooner if needed.

## 2022-05-09 NOTE — Assessment & Plan Note (Signed)
Some better in the meantime, benign abdominal exam, I asked him to observe and update me if any worse or not resolving.

## 2022-05-11 ENCOUNTER — Other Ambulatory Visit: Payer: Self-pay | Admitting: Family Medicine

## 2022-07-02 ENCOUNTER — Other Ambulatory Visit: Payer: Self-pay | Admitting: Family Medicine

## 2022-08-07 ENCOUNTER — Ambulatory Visit (INDEPENDENT_AMBULATORY_CARE_PROVIDER_SITE_OTHER)
Admission: RE | Admit: 2022-08-07 | Discharge: 2022-08-07 | Disposition: A | Discharge status: Home / Self Care | Payer: Medicare Other | Source: Ambulatory Visit | Attending: Family Medicine | Admitting: Family Medicine

## 2022-08-07 ENCOUNTER — Ambulatory Visit: Payer: Medicare Other | Admitting: Family Medicine

## 2022-08-07 ENCOUNTER — Encounter: Payer: Self-pay | Admitting: Family Medicine

## 2022-08-07 VITALS — BP 140/80 | HR 73 | Temp 98.0°F | Ht 67.0 in | Wt 165.0 lb

## 2022-08-07 DIAGNOSIS — R1031 Right lower quadrant pain: Secondary | ICD-10-CM

## 2022-08-07 DIAGNOSIS — E119 Type 2 diabetes mellitus without complications: Secondary | ICD-10-CM

## 2022-08-07 DIAGNOSIS — Z794 Long term (current) use of insulin: Secondary | ICD-10-CM | POA: Diagnosis not present

## 2022-08-07 LAB — POCT GLYCOSYLATED HEMOGLOBIN (HGB A1C): Hemoglobin A1C: 8.4 % — AB (ref 4.0–5.6)

## 2022-08-07 LAB — MICROALBUMIN / CREATININE URINE RATIO
Creatinine,U: 62 mg/dL
Microalb Creat Ratio: 1.3 mg/g (ref 0.0–30.0)
Microalb, Ur: 0.8 mg/dL (ref 0.0–1.9)

## 2022-08-07 MED ORDER — LANTUS SOLOSTAR 100 UNIT/ML ~~LOC~~ SOPN: 20.0000 [IU] | PEN_INJECTOR | Freq: Every day | SUBCUTANEOUS | Status: DC

## 2022-08-07 NOTE — Progress Notes (Unsigned)
Diabetes:  Using medications without difficulties: no Hypoglycemic episodes: no- lowest was 70 and that was a single event Hyperglycemic episodes:no Feet problems: burning at baseline.   Blood Sugars averaging: 99-150, usually ~130.   eye exam within last year: has f/u pending.   A1c down from 11.9 to 8.4.  d/w pt at OV.  Taking 20 units insulin a day.    He has episodic RLQ pain.  Not constant.  See exam. No blood in urine or stool, no FCNAVD.  Discussed consideration of abd pelvis CT if back films neg.   Meds, vitals, and allergies reviewed.   ROS: Per HPI unless specifically indicated in ROS section   GEN: nad, alert and oriented HEENT: ncat NECK: supple w/o LA CV: rrr. PULM: ctab, no inc wob ABD: soft, +bs, not ttp EXT: no edema SKIN: no acute rash R inguinal area w/o mass or tenderness or rash.  No hernia noted.   Diabetic foot exam: Normal inspection No skin breakdown No calluses  Normal DP pulses Normal sensation to light touch and monofilament Nails thickened

## 2022-08-07 NOTE — Patient Instructions (Addendum)
Go to the lab on the way out.   If you have mychart we'll likely use that to update you.   Xray and urine test.  Recheck in about 3 months.  A1c at the visit.  Take care.  Glad to see you.

## 2022-08-09 NOTE — Assessment & Plan Note (Signed)
Without mass or tenderness or rash today.  No hernia noted.  He is having episodic pain and it is unclear to me if it is referred pain from his back.  Check plain films today.  It is worth considering CT abdomen pelvis if his back films are unremarkable.  See notes on imaging.

## 2022-08-09 NOTE — Assessment & Plan Note (Signed)
See notes on labs. Recheck in about 3 months.  A1c at the visit.  Continue insulin as is with 20 units a day.

## 2022-08-15 ENCOUNTER — Other Ambulatory Visit: Payer: Self-pay | Admitting: Family Medicine

## 2022-08-15 DIAGNOSIS — M5136 Other intervertebral disc degeneration, lumbar region: Secondary | ICD-10-CM

## 2022-08-27 ENCOUNTER — Telehealth: Payer: Self-pay | Admitting: Family Medicine

## 2022-08-27 DIAGNOSIS — R1031 Right lower quadrant pain: Secondary | ICD-10-CM

## 2022-08-27 NOTE — Telephone Encounter (Signed)
Patient contacted the office regarding recent visit to [facility], states provider there told him he will need a ct scan. Patient has asked if Dr. Para March could put order in if possible. Please advise, thank you.

## 2022-08-27 NOTE — Telephone Encounter (Signed)
I saw the consult note and I put in the order for abd CT.  Would need BMET ahead of time to check Cr. I put in the order. Thanks.

## 2022-08-27 NOTE — Telephone Encounter (Signed)
Spoke with patient and made lab appt on 08/29/22 at 8:15 am.

## 2022-08-29 ENCOUNTER — Other Ambulatory Visit (INDEPENDENT_AMBULATORY_CARE_PROVIDER_SITE_OTHER): Payer: Medicare Other

## 2022-08-29 DIAGNOSIS — R1031 Right lower quadrant pain: Secondary | ICD-10-CM

## 2022-08-29 LAB — BASIC METABOLIC PANEL
BUN: 18 mg/dL (ref 6–23)
CO2: 25 mEq/L (ref 19–32)
Calcium: 9.2 mg/dL (ref 8.4–10.5)
Chloride: 105 mEq/L (ref 96–112)
Creatinine, Ser: 1.42 mg/dL (ref 0.40–1.50)
GFR: 46.44 mL/min — ABNORMAL LOW (ref >60.00)
Glucose, Bld: 130 mg/dL — ABNORMAL HIGH (ref 70–99)
Potassium: 4.4 mEq/L (ref 3.5–5.1)
Sodium: 137 mEq/L (ref 135–145)

## 2022-08-30 ENCOUNTER — Telehealth: Payer: Self-pay | Admitting: Family Medicine

## 2022-08-30 NOTE — Telephone Encounter (Signed)
Patient returned call regarding his lab results. Would like a return call.

## 2022-08-31 NOTE — Telephone Encounter (Signed)
Pt called back returning Kelli's call regarding blood work. Told pt Dr. Lianne Bushy response. Pt had no questions/concerns. Call back # [phone]

## 2022-08-31 NOTE — Telephone Encounter (Signed)
Information added to results note as well.  No further action needed at this time.

## 2022-09-04 ENCOUNTER — Ambulatory Visit
Admission: RE | Admit: 2022-09-04 | Discharge: 2022-09-04 | Disposition: A | Discharge status: Home / Self Care | Payer: Medicare Other | Source: Ambulatory Visit | Attending: Family Medicine | Admitting: Family Medicine

## 2022-09-04 DIAGNOSIS — R1031 Right lower quadrant pain: Secondary | ICD-10-CM

## 2022-09-04 MED ORDER — IOPAMIDOL (ISOVUE-300) INJECTION 61%
100.0000 mL | Freq: Once | INTRAVENOUS | Status: AC | PRN
  Administered 2022-09-04: 100 mL via INTRAVENOUS

## 2022-09-12 ENCOUNTER — Telehealth: Payer: Self-pay | Admitting: Family Medicine

## 2022-09-12 NOTE — Telephone Encounter (Signed)
See result note for further documentation.

## 2022-09-12 NOTE — Telephone Encounter (Signed)
Patient returned call regarding his CT results,would like a call back

## 2022-09-14 NOTE — Telephone Encounter (Signed)
Called patient reviewed all information see results note for documentation.

## 2022-09-14 NOTE — Telephone Encounter (Signed)
Patient called in returning call regarding CT scan.

## 2022-11-01 LAB — HM DIABETES EYE EXAM

## 2022-11-08 ENCOUNTER — Ambulatory Visit: Payer: Medicare Other | Admitting: Family Medicine

## 2022-11-09 ENCOUNTER — Encounter: Payer: Self-pay | Admitting: Family Medicine

## 2022-11-26 ENCOUNTER — Ambulatory Visit (INDEPENDENT_AMBULATORY_CARE_PROVIDER_SITE_OTHER): Payer: Medicare Other | Admitting: Family Medicine

## 2022-11-26 ENCOUNTER — Encounter: Payer: Self-pay | Admitting: Family Medicine

## 2022-11-26 VITALS — BP 108/64 | HR 70 | Temp 97.9°F | Ht 67.0 in | Wt 159.0 lb

## 2022-11-26 DIAGNOSIS — Z0000 Encounter for general adult medical examination without abnormal findings: Secondary | ICD-10-CM | POA: Diagnosis not present

## 2022-11-26 DIAGNOSIS — Z794 Long term (current) use of insulin: Secondary | ICD-10-CM

## 2022-11-26 DIAGNOSIS — E782 Mixed hyperlipidemia: Secondary | ICD-10-CM

## 2022-11-26 DIAGNOSIS — E119 Type 2 diabetes mellitus without complications: Secondary | ICD-10-CM

## 2022-11-26 DIAGNOSIS — Z23 Encounter for immunization: Secondary | ICD-10-CM

## 2022-11-26 DIAGNOSIS — R109 Unspecified abdominal pain: Secondary | ICD-10-CM

## 2022-11-26 DIAGNOSIS — Z7189 Other specified counseling: Secondary | ICD-10-CM

## 2022-11-26 DIAGNOSIS — K219 Gastro-esophageal reflux disease without esophagitis: Secondary | ICD-10-CM

## 2022-11-26 DIAGNOSIS — N401 Enlarged prostate with lower urinary tract symptoms: Secondary | ICD-10-CM | POA: Diagnosis not present

## 2022-11-26 LAB — COMPREHENSIVE METABOLIC PANEL
ALT: 10 U/L (ref 0–53)
AST: 18 U/L (ref 0–37)
Albumin: 4.3 g/dL (ref 3.5–5.2)
Alkaline Phosphatase: 61 U/L (ref 39–117)
BUN: 20 mg/dL (ref 6–23)
CO2: 27 mEq/L (ref 19–32)
Calcium: 9.5 mg/dL (ref 8.4–10.5)
Chloride: 104 mEq/L (ref 96–112)
Creatinine, Ser: 1.42 mg/dL (ref 0.40–1.50)
GFR: 46.36 mL/min — ABNORMAL LOW (ref >60.00)
Glucose, Bld: 127 mg/dL — ABNORMAL HIGH (ref 70–99)
Potassium: 5.3 mEq/L — ABNORMAL HIGH (ref 3.5–5.1)
Sodium: 139 mEq/L (ref 135–145)
Total Bilirubin: 0.7 mg/dL (ref 0.2–1.2)
Total Protein: 7.5 g/dL (ref 6.0–8.3)

## 2022-11-26 LAB — LIPID PANEL
Cholesterol: 177 mg/dL (ref 0–200)
HDL: 53.9 mg/dL (ref >39.00)
LDL Cholesterol: 91 mg/dL (ref 0–99)
NonHDL: 123.09
Total CHOL/HDL Ratio: 3
Triglycerides: 160 mg/dL — ABNORMAL HIGH (ref 0.0–149.0)
VLDL: 32 mg/dL (ref 0.0–40.0)

## 2022-11-26 LAB — HEMOGLOBIN A1C: Hgb A1c MFr Bld: 7.2 % — ABNORMAL HIGH (ref 4.6–6.5)

## 2022-11-26 NOTE — Progress Notes (Unsigned)
I have personally reviewed the Medicare Annual Wellness questionnaire and have noted 1. The patient's medical and social history 2. Their use of alcohol, tobacco or illicit drugs 3. Their current medications and supplements 4. The patient's functional ability including ADL's, fall risks, home safety risks and hearing or visual             impairment. 5. Diet and physical activities 6. Evidence for depression or mood disorders  The patients weight, height, BMI have been recorded in the chart and visual acuity is per eye clinic.  I have made referrals, counseling and provided education to the patient based review of the above and I have provided the pt with a written personalized care plan for preventive services.  Provider list updated- see scanned forms.  Routine anticipatory guidance given to patient.  See health maintenance. The possibility exists that previously documented standard health maintenance information may have been brought forward from a previous encounter into this note.  If needed, that same information has been updated to reflect the current situation based on today's encounter.    Flu 2024 Shingles discussed with patient PNA previously done Tetanus 2013, d/w pt.   Covid vaccine d/w pt.   Colon screening not due given his age. Prostate cancer screening deferred  Advance directive- Son designated if patient were incapacitated.  Cognitive function addressed- see scanned forms- and if abnormal then additional documentation follows.   In addition to [facility], follow up visit for the below conditions:  Diabetes:  Using medications without difficulties: yes Hypoglycemic episodes:no Hyperglycemic episodes:no Feet problems:tingling at baseline.  He'll update me as needed.  Blood Sugars averaging: 80-120 eye exam within last year: yes  D/w pt about episodic RLQ pain.  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  RLQ pain is better.  Less frequent now.   D/w pt about diet.  Worse after eating some foods, ie after a salad.  We talked about not suspecting any ominous issue and that was peace of mind for patient.  He'll update me as needed. No sx now.    Taking prilosec w/o ADE on med and GERD is controlled.   BPH/LUTS.  Still on finasteride and flomax.  Improved on meds.  Frequency is better.    Elevated Cholesterol: Using medications without problems: yes Muscle aches: no Diet compliance: d/w pt.   Exercise: d/w pt.   PMH and SH reviewed  Meds, vitals, and allergies reviewed.   ROS: Per HPI.  Unless specifically indicated otherwise in HPI, the patient denies:  General: fever. Eyes: acute vision changes ENT: sore throat Cardiovascular: chest pain Respiratory: SOB GI: vomiting GU: dysuria Musculoskeletal: acute back pain Derm: acute rash Neuro: acute motor dysfunction Psych: worsening mood Endocrine: polydipsia Heme: bleeding Allergy: hayfever  GEN: nad, alert and oriented HEENT: ncat NECK: supple w/o LA CV: rrr. PULM: ctab, no inc wob ABD: soft, +bs, nontender to palpation. EXT: no edema SKIN: Well-perfused.

## 2022-11-26 NOTE — Patient Instructions (Addendum)
Check with your insurance to see if they will cover the shingles and tetanus shots.   Go to the lab on the way out.   If you have mychart we'll likely use that to update you.    Take care.  Glad to see you.  Plan on recheck in about 6 months.  A1c at the visit.

## 2022-11-29 DIAGNOSIS — K219 Gastro-esophageal reflux disease without esophagitis: Secondary | ICD-10-CM | POA: Insufficient documentation

## 2022-11-29 NOTE — Assessment & Plan Note (Signed)
Advance directive- Son designated if patient were incapacitated.

## 2022-11-29 NOTE — Assessment & Plan Note (Signed)
Flu 2024 Shingles discussed with patient PNA previously done Tetanus 2013, d/w pt.   Covid vaccine d/w pt.   Colon screening not due given his age. Prostate cancer screening deferred  Advance directive- Son designated if patient were incapacitated.  Cognitive function addressed- see scanned forms- and if abnormal then additional documentation follows.

## 2022-11-29 NOTE — Assessment & Plan Note (Signed)
D/w pt about episodic RLQ pain.  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  RLQ pain is better.  Less frequent now.  D/w pt about diet.  Worse after eating some foods, ie after a salad.  We talked about not suspecting any ominous issue and that was peace of mind for patient.  He'll update me as needed. No sx now.

## 2022-11-29 NOTE — Assessment & Plan Note (Signed)
Taking prilosec w/o ADE on med and GERD is controlled.  Would continue as is.

## 2022-11-29 NOTE — Assessment & Plan Note (Signed)
BPH/LUTS.  Still on finasteride and flomax.  Improved on meds.  Frequency is better.   Continue finasteride and Flomax as is.

## 2022-11-29 NOTE — Assessment & Plan Note (Signed)
See notes on labs. Plan on recheck in about 6 months.  A1c at the visit.  Continue Lantus as is in the meantime.

## 2022-11-29 NOTE — Assessment & Plan Note (Signed)
Continue atorvastatin

## 2023-03-07 ENCOUNTER — Telehealth: Payer: Self-pay | Admitting: *Deleted

## 2023-03-07 ENCOUNTER — Other Ambulatory Visit: Payer: Self-pay

## 2023-03-07 MED ORDER — LANTUS SOLOSTAR 100 UNIT/ML ~~LOC~~ SOPN: 20.0000 [IU] | PEN_INJECTOR | Freq: Every day | SUBCUTANEOUS | Status: DC

## 2023-03-07 NOTE — Telephone Encounter (Signed)
 Copied from CRM [id number]. Topic: Clinical - Medication Question >> Mar 07, 2023  8:38 AM Steven Hall wrote: Reason for CRM: pt is stating is that his medical subscriber change and he need his prescription change. Pt would like for nurse to give him call back

## 2023-03-07 NOTE — Telephone Encounter (Signed)
 Spoke with patient who advised that his insurance has changed for his prescription and its now being handled by [facility]. Updated and sent refill request

## 2023-03-11 ENCOUNTER — Telehealth: Payer: Self-pay

## 2023-03-11 ENCOUNTER — Other Ambulatory Visit: Payer: Self-pay

## 2023-03-11 MED ORDER — LANTUS SOLOSTAR 100 UNIT/ML ~~LOC~~ SOPN: 20.0000 [IU] | PEN_INJECTOR | Freq: Every day | SUBCUTANEOUS | 3 refills | Status: DC

## 2023-03-11 NOTE — Telephone Encounter (Signed)
 Copied from CRM [id number]. Topic: Clinical - Medication Question >> Mar 11, 2023 11:20 AM Franky GRADE wrote: Reason for CRM: Patient is calling to speak with Dr.Graham's nurse regarding his insulin  glargine (LANTUS  SOLOSTAR) 100 UNIT/ML Solostar Pen prescription. Per patient they were helping him since his insurance info changed. Patient's best call back number is [phone]

## 2023-03-11 NOTE — Telephone Encounter (Signed)
 Copied from CRM [id number]. Topic: Clinical - Prescription Issue >> Mar 11, 2023  3:49 PM [name] wrote: Reason for CRM: Patient called stating that Dr. Elfredia nurse Su[place] told him to call back in regards to changing his medications due to insurance issue.

## 2023-03-11 NOTE — Addendum Note (Signed)
 Addended by: Leonor Liv on: 03/11/2023 03:45 PM   Modules accepted: Orders

## 2023-03-11 NOTE — Telephone Encounter (Signed)
 Spoke with patient he is aware that insulin refill request has been sent to [facility]

## 2023-03-11 NOTE — Telephone Encounter (Signed)
 Left voicemail for patient to return call to office.

## 2023-03-12 NOTE — Telephone Encounter (Signed)
 Spoke with patient and advised that rx has been sent.

## 2023-05-27 ENCOUNTER — Ambulatory Visit: Payer: Medicare Other | Admitting: Family Medicine

## 2023-05-27 ENCOUNTER — Encounter: Payer: Self-pay | Admitting: Family Medicine

## 2023-05-27 VITALS — BP 142/86 | HR 66 | Temp 97.9°F | Ht 67.0 in | Wt 167.6 lb

## 2023-05-27 DIAGNOSIS — E119 Type 2 diabetes mellitus without complications: Secondary | ICD-10-CM

## 2023-05-27 DIAGNOSIS — Z794 Long term (current) use of insulin: Secondary | ICD-10-CM | POA: Diagnosis not present

## 2023-05-27 LAB — POCT GLYCOSYLATED HEMOGLOBIN (HGB A1C): Hemoglobin A1C: 7 % — AB (ref 4.0–5.6)

## 2023-05-27 MED ORDER — BASAGLAR KWIKPEN 100 UNIT/ML ~~LOC~~ SOPN: 20.0000 [IU] | PEN_INJECTOR | Freq: Every day | SUBCUTANEOUS | 2 refills | Status: DC

## 2023-05-27 NOTE — Progress Notes (Signed)
 Diabetes:  Using medications without difficulties: yes  Hypoglycemic episodes: no Hyperglycemic episodes:no Feet problems:he has some burning and tingling in his feet, since he had covid.  He can put up with it.  He can update me as needed.   Blood Sugars averaging: 76 this AM.  That is lowest he had seen.  Up to 112 in the AMs.  Usually ~100.  eye exam within last year: yes A1c 7, d/w pt at OV.   Prev MALB d/w pt.  No change in plan.    BP had been lower at home.  D/w pt.  Down to systolic 130s.    Meds, vitals, and allergies reviewed.  ROS: Per HPI unless specifically indicated in ROS section   GEN: nad, alert and oriented HEENT: ncat NECK: supple w/o LA CV: rrr. PULM: ctab, no inc wob ABD: soft, +bs EXT: no edema SKIN: well perfused.

## 2023-05-27 NOTE — Patient Instructions (Addendum)
 Keep going with 20 units of insulin.  If you have lows, then cut back by 1 unit per day.   Recheck in about 6 months at a yearly visit.  Labs ahead of time.  Take care.  Glad to see you.  Let me know if you can't get basaglar filled.  Change to that from lantus.

## 2023-05-27 NOTE — Assessment & Plan Note (Signed)
 Change lantus to basaglar, d/w pt.  See AVS.  Keep going with 20 units of insulin.  If you have lows, then cut back by 1 unit per day.   Recheck in about 6 months at a yearly visit.  Labs ahead of time.  Continue work on diet and exercise.

## 2023-10-28 ENCOUNTER — Other Ambulatory Visit: Payer: Self-pay | Admitting: Family Medicine

## 2023-10-28 MED ORDER — BASAGLAR KWIKPEN 100 UNIT/ML ~~LOC~~ SOPN: 20.0000 [IU] | PEN_INJECTOR | Freq: Every day | SUBCUTANEOUS | 2 refills | Status: DC

## 2024-02-21 ENCOUNTER — Encounter: Payer: Self-pay | Admitting: Family Medicine

## 2024-02-21 ENCOUNTER — Ambulatory Visit: Admitting: Family Medicine

## 2024-02-21 VITALS — BP 168/90 | HR 75 | Temp 98.0°F | Ht 67.0 in | Wt 167.1 lb

## 2024-02-21 DIAGNOSIS — Z7984 Long term (current) use of oral hypoglycemic drugs: Secondary | ICD-10-CM

## 2024-02-21 DIAGNOSIS — E782 Mixed hyperlipidemia: Secondary | ICD-10-CM | POA: Diagnosis not present

## 2024-02-21 DIAGNOSIS — E119 Type 2 diabetes mellitus without complications: Secondary | ICD-10-CM | POA: Diagnosis not present

## 2024-02-21 DIAGNOSIS — N401 Enlarged prostate with lower urinary tract symptoms: Secondary | ICD-10-CM | POA: Diagnosis not present

## 2024-02-21 DIAGNOSIS — Z7189 Other specified counseling: Secondary | ICD-10-CM

## 2024-02-21 MED ORDER — PEN NEEDLES 30G X 5 MM MISC: 3 refills | Status: DC

## 2024-02-21 MED ORDER — ATORVASTATIN CALCIUM 10 MG PO TABS: ORAL_TABLET | ORAL | 3 refills | Status: AC

## 2024-02-21 MED ORDER — FINASTERIDE 5 MG PO TABS: 5.0000 mg | ORAL_TABLET | Freq: Every day | ORAL | 3 refills | Status: AC

## 2024-02-21 MED ORDER — LANTUS SOLOSTAR 100 UNIT/ML ~~LOC~~ SOPN: 20.0000 [IU] | PEN_INJECTOR | Freq: Every day | SUBCUTANEOUS | 99 refills | Status: DC

## 2024-02-21 MED ORDER — TAMSULOSIN HCL 0.4 MG PO CAPS: 0.4000 mg | ORAL_CAPSULE | Freq: Every day | ORAL | 3 refills | Status: AC

## 2024-02-21 MED ORDER — BLOOD GLUCOSE TEST VI STRP: ORAL_STRIP | 3 refills | Status: AC

## 2024-02-21 NOTE — Progress Notes (Unsigned)
 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 Noted at night.  Blood Sugars averaging: usually 80-105 eye exam within last year: pending, scheduled.  Labs pending.   Chest pain with exertion:no Edema:no Short of breath:no Average home BPs: not checked.  I asked him to check BP at home and update me if elevated.   Elevated Cholesterol: Using medications without problems:yes Muscle aches: no Diet compliance: yes Exercise: yes, walking.  Labs pending.   Advance directive- Son designated if patient were incapacitated.  LUTS improved with finasteride  and flomax .    Meds, vitals, and allergies reviewed.   ROS: Per HPI unless specifically indicated in ROS section   GEN: nad, alert and oriented HEENT: mucous membranes moist NECK: supple w/o LA CV: rrr. PULM: ctab, no inc wob ABD: soft, +bs EXT: no edema SKIN: no acute rash  Diabetic foot exam: Normal inspection No skin breakdown No calluses  Normal DP pulses Normal sensation to light touch and monofilament Nails thickened.

## 2024-02-21 NOTE — Patient Instructions (Addendum)
 Labs today.  Check BP at home.  If persistently above 140/90, then let me know.   Let me know if you can't get your meds through mail order.  Take care.  Glad to see you.

## 2024-02-22 LAB — CBC WITH DIFFERENTIAL/PLATELET
Absolute Lymphocytes: 2551 {cells}/uL (ref 850–3900)
Absolute Monocytes: 733 {cells}/uL (ref 200–950)
Basophils Absolute: 62 {cells}/uL (ref 0–200)
Basophils Relative: 0.8 %
Eosinophils Absolute: 343 {cells}/uL (ref 15–500)
Eosinophils Relative: 4.4 %
HCT: 47.7 % (ref 39.4–51.1)
Hemoglobin: 16 g/dL (ref 13.2–17.1)
MCH: 29 pg (ref 27.0–33.0)
MCHC: 33.5 g/dL (ref 31.6–35.4)
MCV: 86.4 fL (ref 81.4–101.7)
MPV: 11.4 fL (ref 7.5–12.5)
Monocytes Relative: 9.4 %
Neutro Abs: 4111 {cells}/uL (ref 1500–7800)
Neutrophils Relative %: 52.7 %
Platelets: 142 Thousand/uL (ref 140–400)
RBC: 5.52 Million/uL (ref 4.20–5.80)
RDW: 13.1 % (ref 11.0–15.0)
Total Lymphocyte: 32.7 %
WBC: 7.8 Thousand/uL (ref 3.8–10.8)

## 2024-02-22 LAB — COMPREHENSIVE METABOLIC PANEL WITH GFR
AG Ratio: 1.7 (calc) (ref 1.0–2.5)
ALT: 14 U/L (ref 9–46)
AST: 19 U/L (ref 10–35)
Albumin: 4.4 g/dL (ref 3.6–5.1)
Alkaline phosphatase (APISO): 59 U/L (ref 35–144)
BUN/Creatinine Ratio: 11 (calc) (ref 6–22)
BUN: 15 mg/dL (ref 7–25)
CO2: 26 mmol/L (ref 20–32)
Calcium: 9.2 mg/dL (ref 8.6–10.3)
Chloride: 104 mmol/L (ref 98–110)
Creat: 1.33 mg/dL — ABNORMAL HIGH (ref 0.70–1.22)
Globulin: 2.6 g/dL (ref 1.9–3.7)
Glucose, Bld: 84 mg/dL (ref 65–99)
Potassium: 4.2 mmol/L (ref 3.5–5.3)
Sodium: 138 mmol/L (ref 135–146)
Total Bilirubin: 0.5 mg/dL (ref 0.2–1.2)
Total Protein: 7 g/dL (ref 6.1–8.1)
eGFR: 53 mL/min/1.73m2 — ABNORMAL LOW

## 2024-02-22 LAB — HEMOGLOBIN A1C
Hgb A1c MFr Bld: 6.9 % — ABNORMAL HIGH
Mean Plasma Glucose: 151 mg/dL
eAG (mmol/L): 8.4 mmol/L

## 2024-02-22 LAB — LIPID PANEL
Cholesterol: 160 mg/dL
HDL: 50 mg/dL
LDL Cholesterol (Calc): 83 mg/dL
Non-HDL Cholesterol (Calc): 110 mg/dL
Total CHOL/HDL Ratio: 3.2 (calc)
Triglycerides: 177 mg/dL — ABNORMAL HIGH

## 2024-02-25 ENCOUNTER — Telehealth: Payer: Self-pay

## 2024-02-25 MED ORDER — BD PEN NEEDLE MINI U/F 31G X 5 MM MISC: 3 refills | Status: AC

## 2024-02-25 NOTE — Assessment & Plan Note (Signed)
Advance directive- Son designated if patient were incapacitated.

## 2024-02-25 NOTE — Telephone Encounter (Signed)
 Noted. Thanks.  Sent rx for 31g 5mm.

## 2024-02-25 NOTE — Assessment & Plan Note (Signed)
Continue atorvastatin.  See notes on labs. 

## 2024-02-25 NOTE — Addendum Note (Signed)
 Addended by: CLEATUS ARLYSS RAMAN on: 02/25/2024 01:32 PM   Modules accepted: Orders

## 2024-02-25 NOTE — Assessment & Plan Note (Signed)
 LUTS improved with finasteride  and flomax .   Continue as is.

## 2024-02-25 NOTE — Assessment & Plan Note (Signed)
 See notes on labs.  Continue insulin . I asked him to check BP at home and update me if elevated.

## 2024-02-25 NOTE — Telephone Encounter (Signed)
 Contacted [facility] and notified of new script sent in for pen needles.  No further questions or concerns.

## 2024-02-25 NOTE — Telephone Encounter (Signed)
 Copied from CRM #[id number]. Topic: Clinical - Medication Question >> Feb 24, 2024  3:43 PM Robinson H wrote: Reason for CRM: [place] calling with [facility] pharmacy stating the need an alternative tor the Insulin  Pen Needle (PEN NEEDLES) 30G X 5 MM MISC they don't carry anymore. Closest they can get is 31G X 5 mm  [place] [phone] Comments: Reference [id number]

## 2024-02-26 ENCOUNTER — Ambulatory Visit: Payer: Self-pay | Admitting: Family Medicine

## 2024-03-06 ENCOUNTER — Telehealth: Payer: Self-pay

## 2024-03-06 NOTE — Telephone Encounter (Signed)
 Copied from CRM #[id number]. Topic: Clinical - Prescription Issue >> Mar 06, 2024  8:43 AM Robinson DEL wrote: Reason for CRM: Patient states that his insurance won't cover his insulin  glargine (LANTUS  SOLOSTAR) 100 UNIT/ML Solostar Pen and he needs something else.  [place] [phone]

## 2024-03-13 MED ORDER — BASAGLAR KWIKPEN 100 UNIT/ML ~~LOC~~ SOPN: 20.0000 [IU] | PEN_INJECTOR | Freq: Every day | SUBCUTANEOUS | 1 refills | Status: AC

## 2024-03-13 NOTE — Telephone Encounter (Signed)
 What will his insurance cover? I sent basaglar  in case that will get covered.

## 2024-03-13 NOTE — Addendum Note (Signed)
 Addended by: CLEATUS LORELI RAMAN on: 03/13/2024 04:47 PM   Modules accepted: Orders

## 2024-03-16 NOTE — Telephone Encounter (Signed)
 Left message to return call to office. Ok to relay/ask message below.

## 2024-03-18 NOTE — Telephone Encounter (Signed)
 Called patient left message to call office. I have reached out to mail order and claim was covered and being sent out [employer]. Will close encounter and await call from patient if any issues getting delivered as we are not able to reach by phone at this time.

## 2024-03-18 NOTE — Telephone Encounter (Signed)
 Patient called, but nurse was out to lunch at the time. Please call back after lunch.

## 2024-03-20 NOTE — Telephone Encounter (Signed)
 Called patent has received medications.  No further action needed at this time.
